# Patient Record
Sex: Male | Born: 1990 | Race: White | Hispanic: No | Marital: Married | Smoking: Never smoker
Health system: Southern US, Community
[De-identification: ages and names within clinical notes are randomized; demographics above are authoritative.]

## PROBLEM LIST (undated history)

## (undated) DIAGNOSIS — F419 Anxiety disorder, unspecified: Secondary | ICD-10-CM

## (undated) DIAGNOSIS — K219 Gastro-esophageal reflux disease without esophagitis: Secondary | ICD-10-CM

## (undated) DIAGNOSIS — T7840XA Allergy, unspecified, initial encounter: Secondary | ICD-10-CM

## (undated) HISTORY — DX: Gastro-esophageal reflux disease without esophagitis: K21.9

## (undated) HISTORY — DX: Allergy, unspecified, initial encounter: T78.40XA

---

## 2019-08-21 ENCOUNTER — Emergency Department (HOSPITAL_COMMUNITY)
Admission: EM | Admit: 2019-08-21 | Discharge: 2019-08-21 | Disposition: A | Discharge status: Home / Self Care | Payer: BC Managed Care – PPO | Attending: Emergency Medicine | Admitting: Emergency Medicine

## 2019-08-21 ENCOUNTER — Other Ambulatory Visit: Payer: Self-pay

## 2019-08-21 ENCOUNTER — Emergency Department (HOSPITAL_COMMUNITY): Payer: BC Managed Care – PPO

## 2019-08-21 ENCOUNTER — Encounter (HOSPITAL_COMMUNITY): Payer: Self-pay

## 2019-08-21 DIAGNOSIS — R000 Tachycardia, unspecified: Secondary | ICD-10-CM | POA: Diagnosis not present

## 2019-08-21 DIAGNOSIS — R079 Chest pain, unspecified: Secondary | ICD-10-CM

## 2019-08-21 DIAGNOSIS — R0789 Other chest pain: Secondary | ICD-10-CM | POA: Insufficient documentation

## 2019-08-21 LAB — BASIC METABOLIC PANEL
Anion gap: 14 (ref 5–15)
BUN: 11 mg/dL (ref 6–20)
CO2: 24 mmol/L (ref 22–32)
Calcium: 9.2 mg/dL (ref 8.9–10.3)
Chloride: 101 mmol/L (ref 98–111)
Creatinine, Ser: 1.16 mg/dL (ref 0.61–1.24)
GFR calc Af Amer: >60 mL/min (ref >60)
GFR calc non Af Amer: >60 mL/min (ref >60)
Glucose, Bld: 134 mg/dL — ABNORMAL HIGH (ref 70–99)
Potassium: 3.1 mmol/L — ABNORMAL LOW (ref 3.5–5.1)
Sodium: 139 mmol/L (ref 135–145)

## 2019-08-21 LAB — CBC
HCT: 44.7 % (ref 39.0–52.0)
Hemoglobin: 14.3 g/dL (ref 13.0–17.0)
MCH: 27.1 pg (ref 26.0–34.0)
MCHC: 32 g/dL (ref 30.0–36.0)
MCV: 84.8 fL (ref 80.0–100.0)
Platelets: 202 10*3/uL (ref 150–400)
RBC: 5.27 MIL/uL (ref 4.22–5.81)
RDW: 14.4 % (ref 11.5–15.5)
WBC: 17.7 10*3/uL — ABNORMAL HIGH (ref 4.0–10.5)
nRBC: 0 % (ref 0.0–0.2)

## 2019-08-21 LAB — TROPONIN I (HIGH SENSITIVITY): Troponin I (High Sensitivity): <2 ng/L (ref <18)

## 2019-08-21 MED ORDER — SODIUM CHLORIDE 0.9% FLUSH: 3.0000 mL | Freq: Once | INTRAVENOUS | Status: DC

## 2019-08-21 NOTE — ED Triage Notes (Signed)
Pt reports L chest pain and epigastric pain with radiation down to his abdomen. Reports that it started around 10p. Endorses alcohol and a weed brownie before symptoms started. A&Ox4. No SOB or cough.

## 2019-08-21 NOTE — ED Notes (Signed)
Patient transported to X-ray 

## 2019-08-21 NOTE — ED Provider Notes (Signed)
[facility]-EMERGENCY DEPT Provider Note   CSN: [id number] Arrival date & time: 08/21/19  0329     History Chief Complaint  Patient presents with  . Chest Pain    Ranger Petrich is a 29 y.o. male.  The history is provided by the patient and medical records.  Chest Pain   29 year old male with no significant past medical history presenting to the ED with chest pain.  Patient reports he was drinking tonight with friends and ate an edible brownie with large quantity of marijuana in it.  States he has never had marijuana before.  States now his chest and his arms feel very "heavy".  He does feel like his heart is racing a bit.  He denies any shortness of breath.  No cough, fever, or other upper respiratory symptoms.  He denies any known cardiac history.  He is not a smoker.  History reviewed. No pertinent past medical history.  There are no problems to display for this patient.      History reviewed. No pertinent family history.  Social History   Tobacco Use  . Smoking status: Not on file  Substance Use Topics  . Alcohol use: Not on file  . Drug use: Not on file    Home Medications Prior to Admission medications   Not on File    Allergies    Patient has no known allergies.  Review of Systems   Review of Systems  Cardiovascular: Positive for chest pain.  All other systems reviewed and are negative.   Physical Exam Updated Vital Signs BP (!) 165/65 (BP Location: Left Arm)   Pulse (!) 113   Temp 98.5 F (36.9 C) (Oral)   Resp (!) 21   Ht 6\' 4"  (1.93 m)   Wt (!) 145.2 kg   SpO2 100%   BMI 38.95 kg/m   Physical Exam Vitals and nursing note reviewed.  Constitutional:      Appearance: He is well-developed.     Comments: Obese, NAD  HENT:     Head: Normocephalic and atraumatic.  Eyes:     Conjunctiva/sclera: Conjunctivae normal.     Pupils: Pupils are equal, round, and reactive to light.  Cardiovascular:     Rate and Rhythm: Regular  rhythm. Tachycardia present.     Heart sounds: Normal heart sounds.     Comments: Tachy around 105-110 during exam Pulmonary:     Effort: Pulmonary effort is normal.     Breath sounds: Normal breath sounds. No decreased breath sounds or wheezing.  Abdominal:     General: Bowel sounds are normal.     Palpations: Abdomen is soft.  Musculoskeletal:        General: Normal range of motion.     Cervical back: Normal range of motion.  Skin:    General: Skin is warm and dry.  Neurological:     Mental Status: He is alert and oriented to person, place, and time.     ED Results / Procedures / Treatments   Labs (all labs ordered are listed, but only abnormal results are displayed) Labs Reviewed  CBC - Abnormal; Notable for the following components:      Result Value   WBC 17.7 (*)    All other components within normal limits  BASIC METABOLIC PANEL  TROPONIN I (HIGH SENSITIVITY)  TROPONIN I (HIGH SENSITIVITY)    EKG None  Radiology DG Chest 2 View  Result Date: 08/21/2019 CLINICAL DATA:  Chest pain. EXAM: CHEST -  2 VIEW COMPARISON:  None. FINDINGS: The cardiomediastinal contours are normal. The lungs are clear. Pulmonary vasculature is normal. No consolidation, pleural effusion, or pneumothorax. No acute osseous abnormalities are seen. IMPRESSION: Unremarkable radiographs of the chest. Electronically Signed   By: Keith Rake M.D.   On: 08/21/2019 04:20    Procedures Procedures (including critical care time)  Medications Ordered in ED Medications  sodium chloride flush (NS) 0.9 % injection 3 mL (0 mLs Intravenous Hold 08/21/19 0404)    ED Course  I have reviewed the triage vital signs and the nursing notes.  Pertinent labs & imaging results that were available during my care of the patient were reviewed by me and considered in my medical decision making (see chart for details).    MDM Rules/Calculators/A&P  29 year old male here with chest pain after drinking alcohol and  eating an edible brownie with large amount of marijuana.  States his chest and arms feel "heavy".  He is tachycardic on arrival around 125, this improved to around 105 during my exam.  He does feel his heart racing a bit.  EKG with sinus tachycardia but no acute ischemic changes.  CXR performed from triage and is clear.  Labs are pending, suspect this is due to substance abuse.  Labs overall reassuring.  Troponin negative.  Suspect symptoms due to his alcohol and marijuana use tonight.  Symptoms are atypical for ACS, PE, dissection, acute cardiac event.  Tachycardia continues improving.  Feel he is stable for discharge.  Recommend to avoid illicit substances, especially when combined with alcohol.  Can follow-up with PCP.  Return here for any new/acute changes.  Final Clinical Impression(s) / ED Diagnoses Final diagnoses:  Chest pain in adult    Rx / DC Orders ED Discharge Orders    None       Larene Pickett, PA-C 08/21/19 0559    Fatima Blank, MD 08/21/19 [phone]

## 2019-08-21 NOTE — Discharge Instructions (Signed)
All cardiac tests today looked great. Try to avoid marijuana and other illicit substances, especially when mixed with alcohol. Follow-up with your primary care doctor as needed. Return here for any new/acute changes.

## 2020-05-05 IMAGING — CR DG CHEST 2V
2 series · 2 of 2 positions shown · non-contrast
Comparison: None.

CLINICAL DATA: Chest pain.

EXAM:
CHEST - 2 VIEW

[w chest lat]
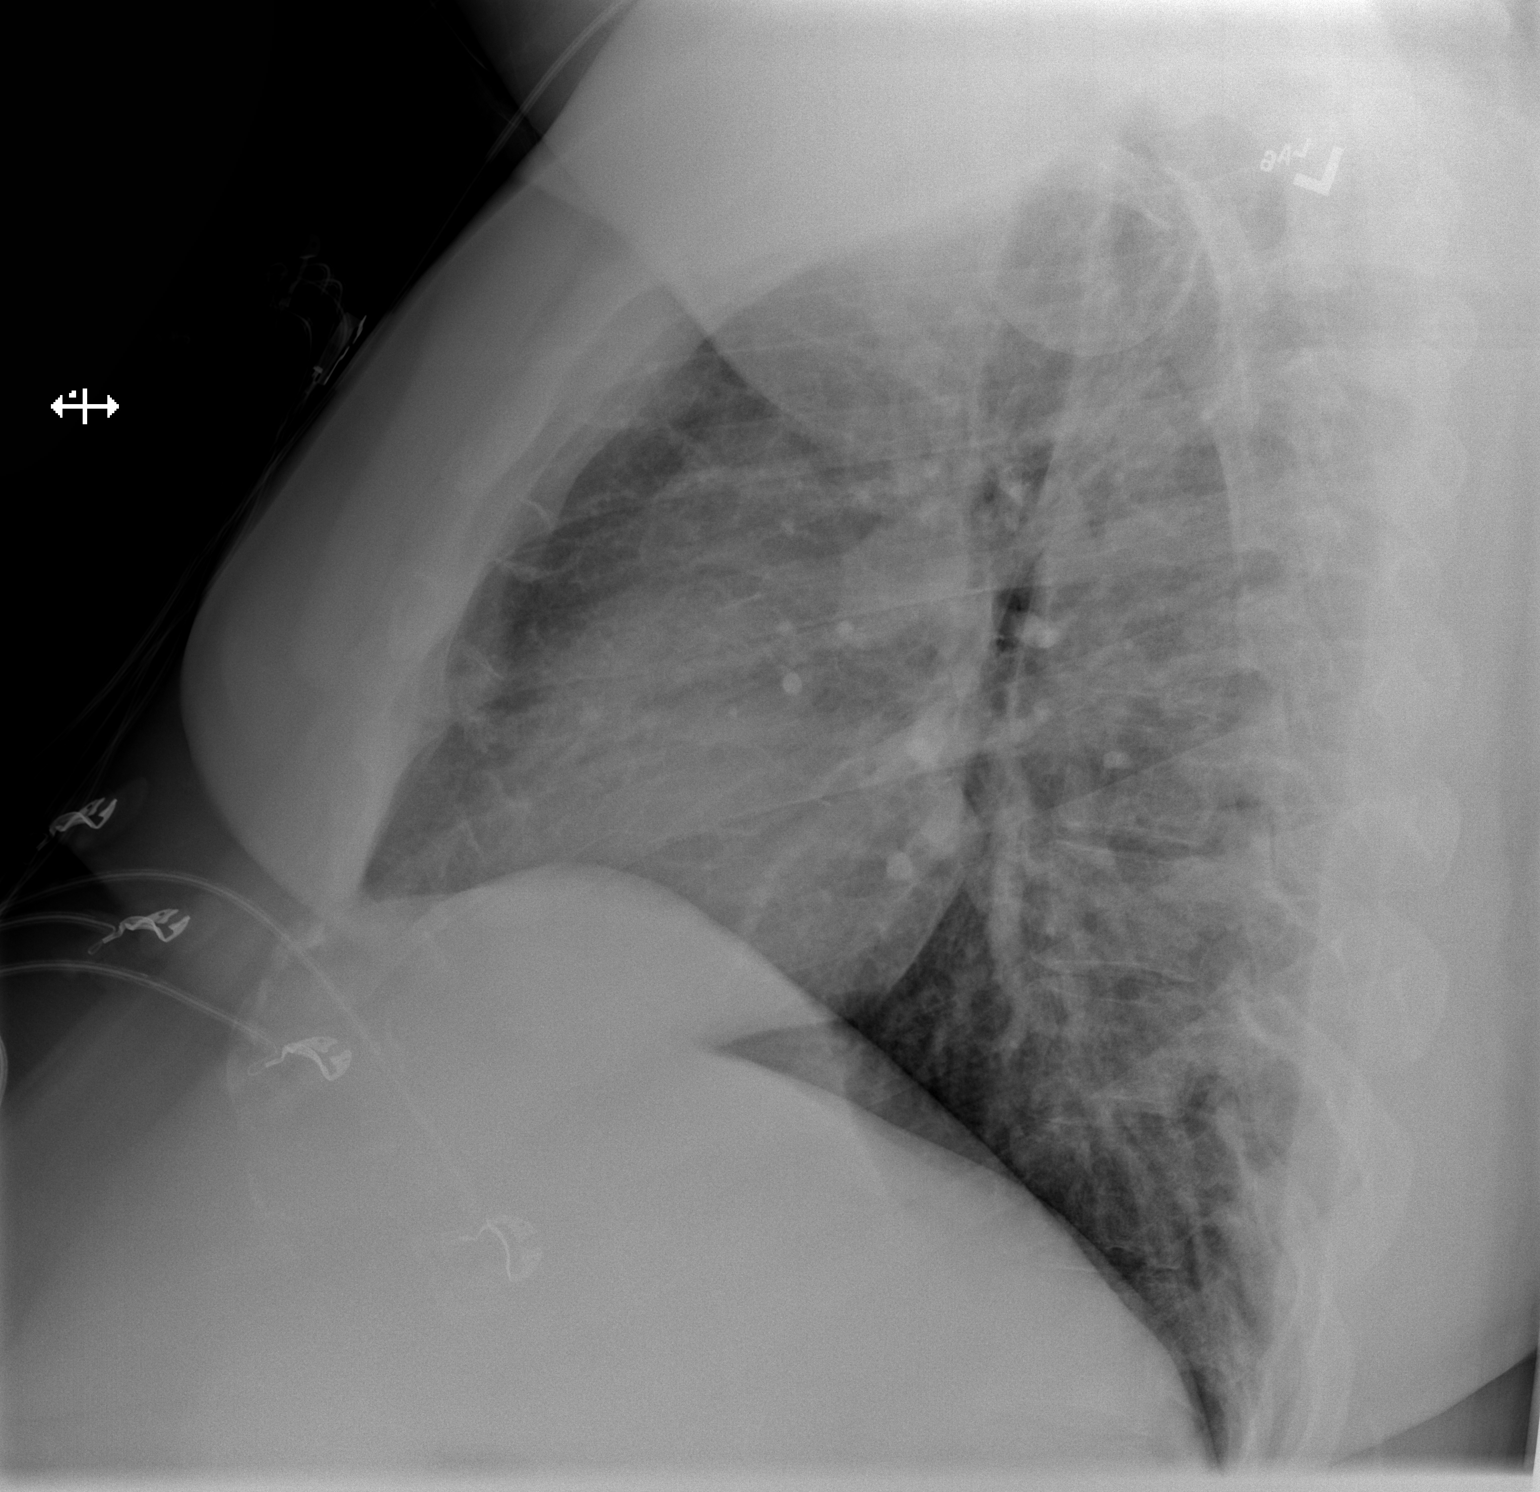

[x chest ap]
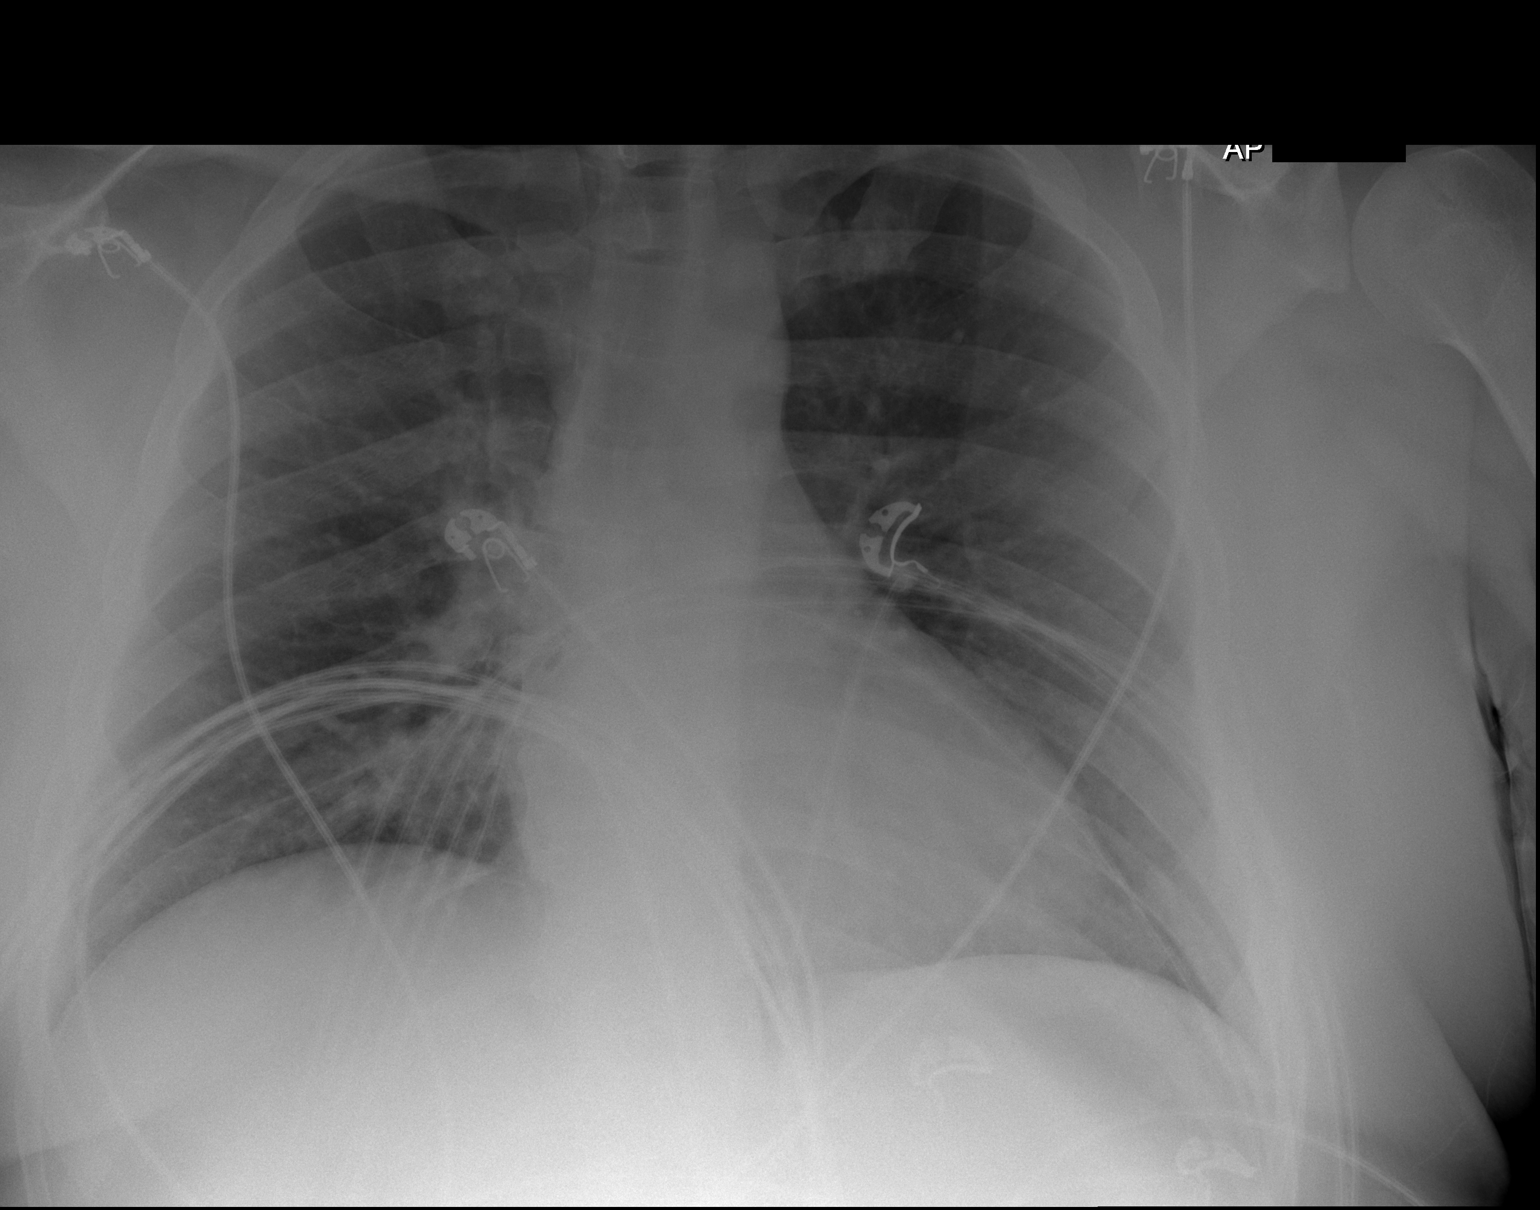

[2 of 2 positions shown; findings below may reference images not displayed]

FINDINGS: The cardiomediastinal contours are normal. The lungs are clear.
Pulmonary vasculature is normal. No consolidation, pleural effusion,
or pneumothorax. No acute osseous abnormalities are seen.
IMPRESSION: Unremarkable radiographs of the chest.

## 2020-09-09 ENCOUNTER — Ambulatory Visit: Payer: Self-pay | Admitting: Nurse Practitioner

## 2020-09-15 ENCOUNTER — Ambulatory Visit: Payer: Self-pay | Admitting: Nurse Practitioner

## 2021-08-15 ENCOUNTER — Ambulatory Visit: Payer: Self-pay

## 2022-06-08 ENCOUNTER — Emergency Department (HOSPITAL_COMMUNITY)
Admission: EM | Admit: 2022-06-08 | Discharge: 2022-06-09 | Disposition: A | Discharge status: Home / Self Care | Payer: BC Managed Care – PPO | Attending: Emergency Medicine | Admitting: Emergency Medicine

## 2022-06-08 ENCOUNTER — Emergency Department (HOSPITAL_COMMUNITY): Payer: BC Managed Care – PPO

## 2022-06-08 ENCOUNTER — Encounter (HOSPITAL_COMMUNITY): Payer: Self-pay | Admitting: Emergency Medicine

## 2022-06-08 ENCOUNTER — Other Ambulatory Visit: Payer: Self-pay

## 2022-06-08 DIAGNOSIS — M25512 Pain in left shoulder: Secondary | ICD-10-CM | POA: Insufficient documentation

## 2022-06-08 DIAGNOSIS — R110 Nausea: Secondary | ICD-10-CM | POA: Diagnosis not present

## 2022-06-08 DIAGNOSIS — R1013 Epigastric pain: Secondary | ICD-10-CM | POA: Insufficient documentation

## 2022-06-08 DIAGNOSIS — R42 Dizziness and giddiness: Secondary | ICD-10-CM | POA: Diagnosis present

## 2022-06-08 HISTORY — DX: Anxiety disorder, unspecified: F41.9

## 2022-06-08 LAB — COMPREHENSIVE METABOLIC PANEL
ALT: 30 U/L (ref 0–44)
AST: 24 U/L (ref 15–41)
Albumin: 4.8 g/dL (ref 3.5–5.0)
Alkaline Phosphatase: 57 U/L (ref 38–126)
Anion gap: 18 — ABNORMAL HIGH (ref 5–15)
BUN: 10 mg/dL (ref 6–20)
CO2: 23 mmol/L (ref 22–32)
Calcium: 10.5 mg/dL — ABNORMAL HIGH (ref 8.9–10.3)
Chloride: 101 mmol/L (ref 98–111)
Creatinine, Ser: 1.09 mg/dL (ref 0.61–1.24)
GFR, Estimated: >60 mL/min (ref >60)
Glucose, Bld: 96 mg/dL (ref 70–99)
Potassium: 4.4 mmol/L (ref 3.5–5.1)
Sodium: 142 mmol/L (ref 135–145)
Total Bilirubin: 1.2 mg/dL (ref 0.3–1.2)
Total Protein: 8.6 g/dL — ABNORMAL HIGH (ref 6.5–8.1)

## 2022-06-08 LAB — CBC WITH DIFFERENTIAL/PLATELET
Abs Immature Granulocytes: 0.02 10*3/uL (ref 0.00–0.07)
Basophils Absolute: 0.1 10*3/uL (ref 0.0–0.1)
Basophils Relative: 1 %
Eosinophils Absolute: 0.1 10*3/uL (ref 0.0–0.5)
Eosinophils Relative: 1 %
HCT: 46.7 % (ref 39.0–52.0)
Hemoglobin: 15.3 g/dL (ref 13.0–17.0)
Immature Granulocytes: 0 %
Lymphocytes Relative: 30 %
Lymphs Abs: 2.6 10*3/uL (ref 0.7–4.0)
MCH: 27.5 pg (ref 26.0–34.0)
MCHC: 32.8 g/dL (ref 30.0–36.0)
MCV: 83.8 fL (ref 80.0–100.0)
Monocytes Absolute: 0.6 10*3/uL (ref 0.1–1.0)
Monocytes Relative: 7 %
Neutro Abs: 5.5 10*3/uL (ref 1.7–7.7)
Neutrophils Relative %: 61 %
Platelets: 202 10*3/uL (ref 150–400)
RBC: 5.57 MIL/uL (ref 4.22–5.81)
RDW: 13.7 % (ref 11.5–15.5)
WBC: 8.9 10*3/uL (ref 4.0–10.5)
nRBC: 0 % (ref 0.0–0.2)

## 2022-06-08 LAB — TROPONIN I (HIGH SENSITIVITY)
Troponin I (High Sensitivity): <2 ng/L (ref <18)
Troponin I (High Sensitivity): 4 ng/L (ref <18)

## 2022-06-08 MED ORDER — FAMOTIDINE 20 MG PO TABS
20.0000 mg | ORAL_TABLET | Freq: Once | ORAL | Status: AC
  Administered 2022-06-08: 20 mg via ORAL
  Filled 2022-06-08: qty 1

## 2022-06-08 MED ORDER — LIDOCAINE VISCOUS HCL 2 % MT SOLN
15.0000 mL | Freq: Once | OROMUCOSAL | Status: AC
  Administered 2022-06-08: 15 mL via ORAL
  Filled 2022-06-08: qty 15

## 2022-06-08 MED ORDER — HYDROXYZINE HCL 25 MG PO TABS
25.0000 mg | ORAL_TABLET | Freq: Once | ORAL | Status: AC
  Administered 2022-06-08: 25 mg via ORAL
  Filled 2022-06-08: qty 1

## 2022-06-08 MED ORDER — ALUM & MAG HYDROXIDE-SIMETH 200-200-20 MG/5ML PO SUSP
30.0000 mL | Freq: Once | ORAL | Status: AC
  Administered 2022-06-08: 30 mL via ORAL
  Filled 2022-06-08: qty 30

## 2022-06-08 NOTE — ED Triage Notes (Signed)
Patient arrives ambulatory by POV c/o nausea, light headed feeling and left shoulder pain. Patient was seen at [facility] last week started on Protonix and carafate. States he noticed his symptoms improved depending on what he ate. Denies any pain at this time and states he is trying to keep himself calm.

## 2022-06-08 NOTE — ED Provider Triage Note (Signed)
Emergency Medicine Provider Triage Evaluation Note  Oscar Powers , a 31 y.o. male  was evaluated in triage.  Pt complains of nausea, left shoulder pain, lightheadedness.  Patient states he has a history of similar events in the past.  He reports he went to an urgent care 1 week ago and was given Carafate as well as omeprazole which helped his symptoms up until yesterday.  He reports being out of Carafate today.  Symptoms began yesterday when he was eating.  He reports persistence of symptoms since onset.  Patient reports feeling anxious once noticing symptoms which "makes my symptoms worse."  Denies fever, chills, night sweats, cough, congestion, shortness of breath, chest pain, vomiting, abdominal pain..  Review of Systems  Positive: See above Negative:   Physical Exam  BP (!) 152/93 (BP Location: Right Arm)   Pulse 62   Temp 98.4 F (36.9 C) (Oral)   Resp 18   Ht 6\' 4"  (1.93 m)   Wt (!) 145.2 kg   SpO2 100%   BMI 38.95 kg/m  Gen:   Awake, no distress   Resp:  Normal effort  MSK:   Moves extremities without difficulty  Other:  No obvious murmurs gallops or rubs.  Lungs clear to auscultation bilaterally.  No abdominal tenderness to exam  Medical Decision Making  Medically screening exam initiated at 11:03 AM.  Appropriate orders placed.  Oscar Powers was informed that the remainder of the evaluation will be completed by another provider, this initial triage assessment does not replace that evaluation, and the importance of remaining in the ED until their evaluation is complete.     Wilnette Kales, [place] 06/08/22 1105

## 2022-06-09 ENCOUNTER — Telehealth: Payer: Self-pay

## 2022-06-09 LAB — LIPASE, BLOOD: Lipase: 31 U/L (ref 11–51)

## 2022-06-09 MED ORDER — FAMOTIDINE 20 MG PO TABS: 20.0000 mg | ORAL_TABLET | Freq: Two times a day (BID) | ORAL | 0 refills | Status: DC

## 2022-06-09 NOTE — ED Provider Notes (Signed)
[facility] EMERGENCY DEPARTMENT Provider Note   CSN: [id number] Arrival date & time: 06/08/22  0915     History  Chief Complaint  Patient presents with   Nausea   Dizziness    Oscar Powers is a 31 y.o. male who presents to the ER with concerns of nausea, lightheadedness, and left shoulder pain.  Patient was seen in urgent care last week and started on Protonix and sucralfate.  He states he notices his symptoms improved depending on what he ate and felt like the sucralfate helped improve his symptoms as well.  He is currently denying any pain, states when he did have pain it was to his left shoulder and under his left rib cage.  This painful feeling caused him to feel very anxious and he felt like he may have a panic attack.  Patient has been following a bland diet, however, he reports still consuming some spicy foods and orange juice.  He also reports symptoms of gagging, typically worse in the morning, with some regurgitation but not vomiting.  Deni chest pain, shortness of breath, syncope, vomiting, diarrhea, constipation, fever, chills, urinary changes.      Home Medications Prior to Admission medications   Medication Sig Start Date End Date Taking? Authorizing Provider  famotidine (PEPCID) 20 MG tablet Take 1 tablet (20 mg total) by mouth 2 (two) times daily. 06/09/22 07/09/22 Yes Tamiya Colello R, PA      Allergies    Patient has no known allergies.    Review of Systems   Review of Systems  Constitutional:  Negative for appetite change, chills and fever.  Cardiovascular:  Negative for chest pain, palpitations and leg swelling.  Gastrointestinal:  Positive for nausea. Negative for abdominal pain, constipation, diarrhea and vomiting.  Musculoskeletal:        Shoulder pain and rib cage pain  Neurological:  Positive for light-headedness. Negative for dizziness, syncope and weakness.    Physical Exam Updated Vital Signs BP (!) 124/58   Pulse 62   Temp  98.1 F (36.7 C)   Resp 16   Ht 6\' 4"  (1.93 m)   Wt (!) 145.2 kg   SpO2 99%   BMI 38.95 kg/m  Physical Exam  ED Results / Procedures / Treatments   Labs (all labs ordered are listed, but only abnormal results are displayed) Labs Reviewed  COMPREHENSIVE METABOLIC PANEL - Abnormal; Notable for the following components:      Result Value   Calcium 10.5 (*)    Total Protein 8.6 (*)    Anion gap 18 (*)    All other components within normal limits  CBC WITH DIFFERENTIAL/PLATELET  LIPASE, BLOOD  LIPASE, BLOOD  TROPONIN I (HIGH SENSITIVITY)  TROPONIN I (HIGH SENSITIVITY)    EKG EKG Interpretation  Date/Time:  Friday June 08 2022 10:46:14 EDT Ventricular Rate:  72 PR Interval:  152 QRS Duration: 104 QT Interval:  386 QTC Calculation: 422 R Axis:   134 Text Interpretation: Normal sinus rhythm with sinus arrhythmia Right axis deviation Abnormal ECG Since last tracing rate slower Otherwise no significant change Confirmed by [id number] [id number]) on 06/09/2022 1:08:07 AM  Radiology DG Chest 1 View  Result Date: 06/08/2022 CLINICAL DATA:  Chest pain EXAM: CHEST  1 VIEW COMPARISON:  08/21/2019 FINDINGS: The heart size and mediastinal contours are within normal limits. Both lungs are clear. The visualized skeletal structures are unremarkable. IMPRESSION: No active disease. Electronically Signed   By: 10/19/2019.  Shick M.D.  On: 06/08/2022 11:37    Procedures Procedures    Medications Ordered in ED Medications  famotidine (PEPCID) tablet 20 mg (20 mg Oral Given 06/08/22 1114)  alum & mag hydroxide-simeth (MAALOX/MYLANTA) 200-200-20 MG/5ML suspension 30 mL (30 mLs Oral Given 06/08/22 1116)    And  lidocaine (XYLOCAINE) 2 % viscous mouth solution 15 mL (15 mLs Oral Given 06/08/22 1116)  hydrOXYzine (ATARAX) tablet 25 mg (25 mg Oral Given 06/08/22 1114)    ED Course/ Medical Decision Making/ A&P Clinical Course as of 06/09/22 0248  Sat Jun 09, 2022  0203 Lipase, blood [MC]     Clinical Course User Index [MC] Pat Kocher, PA                           Medical Decision Making  Patient presents to the ER with concerns of lightheadedness, shoulder pain, nausea that made him feel anxious.  He also reports symptoms of reflux, is currently undergoing PPI trial.  Finished taking sucralfate.  He has been on PPI since last Wednesday.  He denies any nausea or abdominal pain at this time.  Physical exam findings are reassuring, he has no abdominal pain on palpation, bowel sounds are normal, not currently experiencing rib or shoulder pain.  He states treatment with famotidine and GI cocktail improved his symptoms significantly.  ECG demonstrates sinus rhythm with sinus arrhythmia and right axis deviation.  Chest x-ray reveals no active disease.  Laboratory work-up reveals mildly elevated calcium at 10.5, no leukocytosis, no anemia, troponin negative.  Lipase normal, no evidence of pancreatitis.   Not suspicious for cardiac cause of rib cage and shoulder pain.  Due to timing of patient's symptoms associated with eating reflux triggering foods, likely related to gastroesophageal reflux disease.  He reports that his symptoms were improving on PPI and sucralfate.  Educated patient on managing reflux symptoms at home, following bland diet, and avoiding trigger foods.  Plan to discharge patient home with recommendation of close follow-up with PCP and referral for GI.  I do not feel that any additional work-up or imaging is required at this time.  Plan to discharge patient and change medication to famotidine twice daily, discontinue PPI.  Patient agrees with plan for discharge.  Return precautions given.  Discussed HPI, work-up, assessment and plan with attending Deno Etienne who agrees with plan.         Final Clinical Impression(s) / ED Diagnoses Final diagnoses:  Dyspepsia    Rx / DC Orders ED Discharge Orders          Ordered    famotidine (PEPCID) 20 MG tablet  2 times  daily        06/09/22 0244              Theressa Stamps R, PA 06/09/22 [place], [place], DO 06/09/22 [phone]

## 2022-06-09 NOTE — Telephone Encounter (Signed)
Wife called in for patient needs medication ordered switched to [facility] on [address].. Prescription called in to [facility] on [address]

## 2022-06-09 NOTE — Discharge Instructions (Addendum)
You were seen in the ER for symptoms likely related to reflux.  Your laboratory work-up and ECG were reassuring that cause of your pain is likely not heart related.  I have attached information about indigestion and abdominal pain.  I am also sending you home on a new prescription of famotidine.  You will take this medication twice a day.  Stop taking the PPI.   I highly recommend you set up an appointment with a PCP for continued follow-up for your symptoms.  I am also referring you to a GI doctor.  Return to the ER if you develop new or worsening symptoms.

## 2022-06-11 ENCOUNTER — Other Ambulatory Visit: Payer: Self-pay

## 2022-06-11 ENCOUNTER — Telehealth: Payer: Self-pay

## 2022-06-11 DIAGNOSIS — R42 Dizziness and giddiness: Secondary | ICD-10-CM

## 2022-06-11 NOTE — Addendum Note (Signed)
Addended by: Berniece Salines A on: 06/11/2022 12:03 PM   Modules accepted: Orders

## 2022-06-11 NOTE — Telephone Encounter (Signed)
Cirigliano, Oscar Pea, Oscar Powers  Marice Potter, RN Patient was seen in the ER over the weekend.  No previous evaluations in our clinic.  Can you please call to check in on him early next week.  If ongoing symptoms, can schedule follow-up in the GI clinic.  If symptoms have resolved, can call the office  prn.  Recommend establishing with PCM as well, regardless if symptoms ongoing or resolved to establish care.

## 2022-06-11 NOTE — Telephone Encounter (Signed)
Called and spoke with pt. Pt reports that nausea has completely resolved and the only symptom he has is lightheadedness. Let pt know he can establish care with PCP for lightheadedness.

## 2022-08-21 ENCOUNTER — Ambulatory Visit: Payer: 59 | Admitting: Family Medicine

## 2022-08-21 ENCOUNTER — Encounter: Payer: Self-pay | Admitting: Family Medicine

## 2022-08-21 VITALS — BP 130/78 | HR 61 | Temp 97.5°F | Ht 76.0 in | Wt 318.2 lb

## 2022-08-21 DIAGNOSIS — R1012 Left upper quadrant pain: Secondary | ICD-10-CM | POA: Diagnosis not present

## 2022-08-21 DIAGNOSIS — K219 Gastro-esophageal reflux disease without esophagitis: Secondary | ICD-10-CM | POA: Diagnosis not present

## 2022-08-21 DIAGNOSIS — J0101 Acute recurrent maxillary sinusitis: Secondary | ICD-10-CM | POA: Diagnosis not present

## 2022-08-21 LAB — CBC WITH DIFFERENTIAL/PLATELET
Basophils Absolute: 0 10*3/uL (ref 0.0–0.1)
Basophils Relative: 0.5 % (ref 0.0–3.0)
Eosinophils Absolute: 0.2 10*3/uL (ref 0.0–0.7)
Eosinophils Relative: 2.4 % (ref 0.0–5.0)
HCT: 45.1 % (ref 39.0–52.0)
Hemoglobin: 15.1 g/dL (ref 13.0–17.0)
Lymphocytes Relative: 46 % (ref 12.0–46.0)
Lymphs Abs: 3.5 10*3/uL (ref 0.7–4.0)
MCHC: 33.5 g/dL (ref 30.0–36.0)
MCV: 80.6 fl (ref 78.0–100.0)
Monocytes Absolute: 0.6 10*3/uL (ref 0.1–1.0)
Monocytes Relative: 8.2 % (ref 3.0–12.0)
Neutro Abs: 3.2 10*3/uL (ref 1.4–7.7)
Neutrophils Relative %: 42.9 % — ABNORMAL LOW (ref 43.0–77.0)
Platelets: 196 10*3/uL (ref 150.0–400.0)
RBC: 5.6 Mil/uL (ref 4.22–5.81)
RDW: 14.3 % (ref 11.5–15.5)
WBC: 7.6 10*3/uL (ref 4.0–10.5)

## 2022-08-21 LAB — H. PYLORI ANTIBODY, IGG: H Pylori IgG: NEGATIVE

## 2022-08-21 LAB — COMPREHENSIVE METABOLIC PANEL
ALT: 40 U/L (ref 0–53)
AST: 23 U/L (ref 0–37)
Albumin: 4.8 g/dL (ref 3.5–5.2)
Alkaline Phosphatase: 70 U/L (ref 39–117)
BUN: 11 mg/dL (ref 6–23)
CO2: 27 mEq/L (ref 19–32)
Calcium: 9.8 mg/dL (ref 8.4–10.5)
Chloride: 104 mEq/L (ref 96–112)
Creatinine, Ser: 1.1 mg/dL (ref 0.40–1.50)
GFR: 90.74 mL/min (ref >60.00)
Glucose, Bld: 100 mg/dL — ABNORMAL HIGH (ref 70–99)
Potassium: 4.5 mEq/L (ref 3.5–5.1)
Sodium: 140 mEq/L (ref 135–145)
Total Bilirubin: 0.9 mg/dL (ref 0.2–1.2)
Total Protein: 8 g/dL (ref 6.0–8.3)

## 2022-08-21 LAB — TSH: TSH: 1.74 u[IU]/mL (ref 0.35–5.50)

## 2022-08-21 MED ORDER — AMOXICILLIN-POT CLAVULANATE 875-125 MG PO TABS: 1.0000 | ORAL_TABLET | Freq: Two times a day (BID) | ORAL | 0 refills | Status: DC

## 2022-08-21 NOTE — Progress Notes (Signed)
Labs ok.  Continue w/plan as discussed

## 2022-08-21 NOTE — Progress Notes (Signed)
New Patient Office Visit  Subjective:  Patient ID: Oscar Powers, male    DOB: [birth year]  Age: 32 y.o. MRN: [id number]  CC:  Chief Complaint  Patient presents with   Gastroesophageal Reflux    Pt states 3x years ago on new years, celebrated a little too hard and ended up in ER, pt states had panic attack, and felt pain in left shoulder, thought was having heart attack. Pt states he has panic attack here and there and pain is still present. Pt states went to ER in oct for the same thing, and MD gave lidocaine and Pepcid, pt states it helped tremendously, MD said to take Pepcid twice daily. Pt wants to know what's causing pain in left shoulder, and also has sinus infection   New Patient (Initial Visit)    HPI Oscar Powers presents for new pt.  Pain L shoulder.   Pain L shoulder and uner rib cage.  Intermitt-has been to ER 3 yrs ago-told panic attack. In ER in Oct.  Told GERD-meds helped. Certain foods will flare it-red meats, spicey. Was getting worse and pain worse.  UC-protonix and sulcrafate but got worse so ER in oct-got lidocaine and pepcid and felt better.  Taking pepcid bid since.  Helps a lot but occ getting pain if "eating bad".  Has lost 20#-working on bland diet.  Some gagging but not vomit. No chronic diarrhea.  No dysphagia. Occ dark stools.  2 mo ago, some brbpr(thinks constipated) Sinuses occ-congestion, pressure for 1 wk.  No f/c.  Neg covid. Minor cough.   Panic/anxiety-long time.  Flares when pain L shoulder.  A lot of stress at work and step- Dad has prostate Ca  Past Medical History:  Diagnosis Date   Anxiety     History reviewed. No pertinent surgical history.  Family History  Problem Relation Age of Onset   Hypertension Mother     Social History   Socioeconomic History   Marital status: Married    Spouse name: Not on file   Number of children: 1   Years of education: Not on file   Highest education level: Master's degree (e.g., MA, MS, MEng, MEd, MSW, MBA)   Occupational History   Not on file  Tobacco Use   Smoking status: Never   Smokeless tobacco: Never  Vaping Use   Vaping Use: Never used  Substance and Sexual Activity   Alcohol use: Not Currently   Drug use: Not Currently   Sexual activity: Not on file  Other Topics Concern   Not on file  Social History Narrative   Psychologist, forensic [employer]   Social Determinants of Health   Financial Resource Strain: Not on file  Food Insecurity: Not on file  Transportation Needs: Not on file  Physical Activity: Not on file  Stress: Not on file  Social Connections: Not on file  Intimate Partner Violence: Not on file    ROS  ROS: Gen: no fever, chills  Skin: no rash, itching ENT: no ear pain, ear drainage, nasal congestion, rhinorrhea, sinus pressure, sore throat Eyes: no blurry vision, double vision Resp: no cough, wheeze,SOB CV: no CP, palpitations, LE edema,  GI: no heartburn, n/v/d/c, abd pain GU: no dysuria, urgency, frequency, hematuria MSK: no joint pain, myalgias, back pain Neuro: no dizziness, headache, weakness, vertigo Psych: no depression, anxiety, insomnia, SI   Objective:   Today's Vitals: BP 130/78 (BP Location: Right Arm, Patient Position: Sitting)   Pulse 61   Temp (!) 97.5 F (  36.4 C) (Temporal)   Ht 6\' 4"  (1.93 m)   Wt (!) 318 lb 3.2 oz (144.3 kg)   SpO2 98%   BMI 38.73 kg/m   Physical Exam  Gen: WDWN NAD HEENT: NCAT, conjunctiva not injected, sclera nonicteric TM WNL B, OP moist, no exudates  NECK:  supple, no thyromegaly, no nodes, no carotid bruits CARDIAC: RRR, S1S2+, no murmur.  LUNGS: CTAB. No wheezes ABDOMEN:  BS+, soft, sl tender R of mid abd, No HSM, no masses EXT:  no edema MSK: no gross abnormalities.  NEURO: A&O x3.  CN II-XII intact.  PSYCH: normal mood. Good eye contact   Assessment & Plan:   Problem List Items Addressed This Visit   None Visit Diagnoses     Gastroesophageal reflux disease without esophagitis    -   Primary   Relevant Orders   Comprehensive metabolic panel   CBC with Differential/Platelet   TSH   H. pylori antibody, IgG   US Abdomen Complete   LUQ pain       Relevant Orders   Comprehensive metabolic panel   CBC with Differential/Platelet   TSH   H. pylori antibody, IgG   US Abdomen Complete   Acute recurrent maxillary sinusitis       Relevant Medications   amoxicillin-clavulanate (AUGMENTIN) 875-125 MG tablet      LUQ pain-?PUD,?gallbladder(even though no RUQ pain), other.  Check cbc,cmp,tsh,h pylori, U/S abd.   GERD-chronic.  Some better w/pepcid bid-cont.  Check above labs.  Avoid food triggers.  F/u 1 mo Recurrent Max sinusisit-augmentin 875 bid.  Outpatient Encounter Medications as of 08/21/2022  Medication Sig   amoxicillin-clavulanate (AUGMENTIN) 875-125 MG tablet Take 1 tablet by mouth 2 (two) times daily.   [DISCONTINUED] famotidine (PEPCID) 20 MG tablet Take 1 tablet (20 mg total) by mouth 2 (two) times daily.   No facility-administered encounter medications on file as of 08/21/2022.    Follow-up: Return in about 4 weeks (around 09/18/2022) for annual.   Wellington Hampshire, MD

## 2022-08-21 NOTE — Patient Instructions (Signed)
Welcome to [facility] at [facility]! It was a pleasure meeting you today.  As discussed, Please schedule a 1 month follow up visit today.  PLEASE NOTE:  If you had any LAB tests please let us know if you have not heard back within a few days. You may see your results on MyChart before we have a chance to review them but we will give you a call once they are reviewed by us. If we ordered any REFERRALS today, please let us know if you have not heard from their office within the next week.  Let us know through MyChart if you are needing REFILLS, or have your pharmacy send us the request. You can also use MyChart to communicate with me or any office staff.  Please try these tips to maintain a healthy lifestyle:  Eat most of your calories during the day when you are active. Eliminate processed foods including packaged sweets (pies, cakes, cookies), reduce intake of potatoes, white bread, white pasta, and white rice. Look for whole grain options, oat flour or almond flour.  Each meal should contain half fruits/vegetables, one quarter protein, and one quarter carbs (no bigger than a computer mouse).  Cut down on sweet beverages. This includes juice, soda, and sweet tea. Also watch fruit intake, though this is a healthier sweet option, it still contains natural sugar! Limit to 3 servings daily.  Drink at least 1 glass of water with each meal and aim for at least 8 glasses per day  Exercise at least 150 minutes every week.   

## 2022-09-11 ENCOUNTER — Ambulatory Visit
Admission: RE | Admit: 2022-09-11 | Discharge: 2022-09-11 | Disposition: A | Discharge status: Home / Self Care | Payer: 59 | Source: Ambulatory Visit | Attending: Family Medicine | Admitting: Family Medicine

## 2022-09-11 DIAGNOSIS — R1012 Left upper quadrant pain: Secondary | ICD-10-CM | POA: Diagnosis not present

## 2022-09-11 DIAGNOSIS — K219 Gastro-esophageal reflux disease without esophagitis: Secondary | ICD-10-CM

## 2022-09-11 DIAGNOSIS — K760 Fatty (change of) liver, not elsewhere classified: Secondary | ICD-10-CM | POA: Diagnosis not present

## 2022-09-13 ENCOUNTER — Other Ambulatory Visit: Payer: Self-pay | Admitting: *Deleted

## 2022-09-13 ENCOUNTER — Encounter: Payer: Self-pay | Admitting: Family Medicine

## 2022-09-13 MED ORDER — CEFDINIR 300 MG PO CAPS: 300.0000 mg | ORAL_CAPSULE | Freq: Two times a day (BID) | ORAL | 0 refills | Status: AC

## 2022-09-28 ENCOUNTER — Encounter: Payer: Self-pay | Admitting: Family Medicine

## 2022-09-28 ENCOUNTER — Ambulatory Visit (INDEPENDENT_AMBULATORY_CARE_PROVIDER_SITE_OTHER): Payer: 59 | Admitting: Family Medicine

## 2022-09-28 VITALS — BP 114/79 | HR 64 | Temp 98.4°F | Ht 76.0 in | Wt 317.1 lb

## 2022-09-28 DIAGNOSIS — Z0000 Encounter for general adult medical examination without abnormal findings: Secondary | ICD-10-CM | POA: Diagnosis not present

## 2022-09-28 NOTE — Progress Notes (Signed)
Phone: [phone]   Subjective:  Patient 32 y.o. male presenting for annual physical.  Chief Complaint  Patient presents with   Annual Exam    CPE Not fasting    Annual-trying to exercise.  Seeing DDS.  See problem oriented charting- ROS- ROS: Gen: no fever, chills  Skin: no rash, itching ENT: no ear pain, ear drainage, nasal congestion, rhinorrhea, sinus pressure, sore throat Eyes: no blurry vision, double vision Resp: no cough, wheeze,SOB CV: no CP, palpitations, LE edema,  GI: no heartburn, n/v/d/c,.  Still some abd pain-"little bit"   getting better.   GU: no dysuria, urgency, frequency, hematuria. No ED MSK: no joint pain, myalgias, back pain Neuro: no  headache, weakness, vertigo.  Occ light-headed-poss if not eaten in "awhile" or first am. Drinks water. Psych: no depression, anxiety, insomnia, SI .   Anx and stress.  The following were reviewed and entered/updated in epic: Past Medical History:  Diagnosis Date   Anxiety    There are no problems to display for this patient.  History reviewed. No pertinent surgical history.  Family History  Problem Relation Age of Onset   Hypertension Mother     Medications- reviewed and updated No current outpatient medications on file.   No current facility-administered medications for this visit.    Allergies-reviewed and updated No Known Allergies  Social History   Social History Narrative   Psychologist, forensic [employer]   Objective  Objective:  BP 114/79   Pulse 64   Temp 98.4 F (36.9 C) (Temporal)   Ht 6' 4"$  (1.93 m)   Wt (!) 317 lb 2 oz (143.8 kg)   SpO2 95%   BMI 38.60 kg/m  Physical Exam  Gen: WDWN NAD HEENT: NCAT, conjunctiva not injected, sclera nonicteric TM WNL B, OP moist, no exudates  NECK:  supple, no thyromegaly, no nodes, no carotid bruits CARDIAC: RRR, S1S2+, no murmur. DP 2+B LUNGS: CTAB. No wheezes ABDOMEN:  BS+, soft, NTND, No HSM, no masses EXT:  no edema MSK: no gross  abnormalities. MS 5/5 all 4 NEURO: A&O x3.  CN II-XII intact.  PSYCH: normal mood. Good eye contact     Assessment and Plan   Health Maintenance counseling: 1. Anticipatory guidance: Patient counseled regarding regular dental exams q6 months, eye exams yearly, avoiding smoking and second hand smoke, limiting alcohol to 2 beverages per day.   2. Risk factor reduction:  Advised patient of need for regular exercise and diet rich in fruits and vegetables to reduce risk of heart attack and stroke. Exercise- increase.   Wt Readings from Last 3 Encounters:  09/28/22 (!) 317 lb 2 oz (143.8 kg)  08/21/22 (!) 318 lb 3.2 oz (144.3 kg)  06/08/22 (!) 320 lb (145.2 kg)   3. Immunizations/screenings/ancillary studies Immunization History  Administered Date(s) Administered   Influenza Split 08/18/2012   PFIZER(Purple Top)SARS-COV-2 Vaccination 11/12/2019, 12/03/2019   Tdap 03/02/2022   Health Maintenance Due  Topic Date Due   HIV Screening  Never done   Hepatitis C Screening  Never done    4. Skin cancer screening- Iadvised regular sunscreen use. Denies worrisome, changing, or new skin lesions.  5. Smoking associated screening: non smoker   Problem List Items Addressed This Visit   None Visit Diagnoses     Wellness examination    -  Primary      Wellness-antic guidance.  Rhm UTD.  Reviewed labs from last mo.  F/u 6 mo to check lipids.   Recommended follow up:  [address] in about 6 months (around 03/29/2023) for f/u stomach, wt. No future appointments.   Lab/Order associations:non fasting   ICD-10-CM   1. Wellness examination  Z00.00       No orders of the defined types were placed in this encounter.    AWellington Hampshire MD

## 2022-09-28 NOTE — Patient Instructions (Signed)
It was very nice to see you today!  Keep up the great work on weight.     PLEASE NOTE:  If you had any lab tests please let us know if you have not heard back within a few days. You may see your results on MyChart before we have a chance to review them but we will give you a call once they are reviewed by [place]. If we ordered any referrals today, please let us know if you have not heard from their office within the next week.   Please try these tips to maintain a healthy lifestyle:  Eat most of your calories during the day when you are active. Eliminate processed foods including packaged sweets (pies, cakes, cookies), reduce intake of potatoes, white bread, white pasta, and white rice. Look for whole grain options, oat flour or almond flour.  Each meal should contain half fruits/vegetables, one quarter protein, and one quarter carbs (no bigger than a computer mouse).  Cut down on sweet beverages. This includes juice, soda, and sweet tea. Also watch fruit intake, though this is a healthier sweet option, it still contains natural sugar! Limit to 3 servings daily.  Drink at least 1 glass of water with each meal and aim for at least 8 glasses per day  Exercise at least 150 minutes every week.

## 2022-11-03 ENCOUNTER — Emergency Department (HOSPITAL_BASED_OUTPATIENT_CLINIC_OR_DEPARTMENT_OTHER): Payer: 59

## 2022-11-03 ENCOUNTER — Encounter (HOSPITAL_BASED_OUTPATIENT_CLINIC_OR_DEPARTMENT_OTHER): Payer: Self-pay | Admitting: Emergency Medicine

## 2022-11-03 ENCOUNTER — Emergency Department (HOSPITAL_BASED_OUTPATIENT_CLINIC_OR_DEPARTMENT_OTHER)
Admission: EM | Admit: 2022-11-03 | Discharge: 2022-11-03 | Disposition: A | Discharge status: Home / Self Care | Payer: 59 | Attending: Emergency Medicine | Admitting: Emergency Medicine

## 2022-11-03 ENCOUNTER — Other Ambulatory Visit: Payer: Self-pay

## 2022-11-03 DIAGNOSIS — R079 Chest pain, unspecified: Secondary | ICD-10-CM | POA: Diagnosis not present

## 2022-11-03 DIAGNOSIS — R0789 Other chest pain: Secondary | ICD-10-CM | POA: Diagnosis not present

## 2022-11-03 LAB — CBC WITH DIFFERENTIAL/PLATELET
Abs Immature Granulocytes: 0.03 10*3/uL (ref 0.00–0.07)
Basophils Absolute: 0.1 10*3/uL (ref 0.0–0.1)
Basophils Relative: 1 %
Eosinophils Absolute: 0.2 10*3/uL (ref 0.0–0.5)
Eosinophils Relative: 2 %
HCT: 45 % (ref 39.0–52.0)
Hemoglobin: 14.8 g/dL (ref 13.0–17.0)
Immature Granulocytes: 0 %
Lymphocytes Relative: 45 %
Lymphs Abs: 4.3 10*3/uL — ABNORMAL HIGH (ref 0.7–4.0)
MCH: 26.6 pg (ref 26.0–34.0)
MCHC: 32.9 g/dL (ref 30.0–36.0)
MCV: 80.8 fL (ref 80.0–100.0)
Monocytes Absolute: 0.7 10*3/uL (ref 0.1–1.0)
Monocytes Relative: 8 %
Neutro Abs: 4.1 10*3/uL (ref 1.7–7.7)
Neutrophils Relative %: 44 %
Platelets: 196 10*3/uL (ref 150–400)
RBC: 5.57 MIL/uL (ref 4.22–5.81)
RDW: 14.1 % (ref 11.5–15.5)
WBC: 9.4 10*3/uL (ref 4.0–10.5)
nRBC: 0 % (ref 0.0–0.2)

## 2022-11-03 LAB — BASIC METABOLIC PANEL
Anion gap: 8 (ref 5–15)
BUN: 11 mg/dL (ref 6–20)
CO2: 27 mmol/L (ref 22–32)
Calcium: 10.3 mg/dL (ref 8.9–10.3)
Chloride: 104 mmol/L (ref 98–111)
Creatinine, Ser: 1.18 mg/dL (ref 0.61–1.24)
GFR, Estimated: >60 mL/min (ref >60)
Glucose, Bld: 102 mg/dL — ABNORMAL HIGH (ref 70–99)
Potassium: 3.6 mmol/L (ref 3.5–5.1)
Sodium: 139 mmol/L (ref 135–145)

## 2022-11-03 LAB — TROPONIN I (HIGH SENSITIVITY): Troponin I (High Sensitivity): 2 ng/L (ref <18)

## 2022-11-03 NOTE — ED Notes (Signed)
Pt ambulatory to waiting room. Pt verbalized understanding of discharge instructions.   

## 2022-11-03 NOTE — ED Provider Notes (Signed)
[facility] Provider Note   CSN: [id number] Arrival date & time: 11/03/22  [id number]     History  Chief Complaint  Patient presents with   Chest Pain    Oscar Powers is a 32 y.o. male.  Patient is a 32 year old male with no significant past medical history.  Patient presenting today with complaints of left-sided chest pain.  This has been ongoing for the past 2 days, but became worse this evening and woke him from sleep.  He describes a sharp pain to the left side of his chest that radiates to his neck.  He denies any nausea, shortness of breath, or diaphoresis.  He denies any exertional symptoms.  Patient has no prior cardiac history and no prior cardiac risk factors.  He tells me this is the third time time this has happened in the past 3 years.  The history is provided by the patient.       Home Medications Prior to Admission medications   Medication Sig Start Date End Date Taking? Authorizing Provider  famotidine (PEPCID) 20 MG tablet Take 20 mg by mouth 2 (two) times daily.   Yes [provider]      Allergies    Patient has no known allergies.    Review of Systems   Review of Systems  All other systems reviewed and are negative.   Physical Exam Updated Vital Signs BP 138/83   Temp 98 F (36.7 C)   Resp (!) 21   Wt (!) 137.4 kg   SpO2 100%   BMI 36.88 kg/m  Physical Exam Vitals and nursing note reviewed.  Constitutional:      General: He is not in acute distress.    Appearance: He is well-developed. He is not diaphoretic.  HENT:     Head: Normocephalic and atraumatic.  Cardiovascular:     Rate and Rhythm: Normal rate and regular rhythm.     Heart sounds: No murmur heard.    No friction rub.  Pulmonary:     Effort: Pulmonary effort is normal. No respiratory distress.     Breath sounds: Normal breath sounds. No wheezing or rales.  Abdominal:     General: Bowel sounds are normal. There is no distension.      Palpations: Abdomen is soft.     Tenderness: There is no abdominal tenderness.  Musculoskeletal:        General: Normal range of motion.     Cervical back: Normal range of motion and neck supple.     Right lower leg: No tenderness. No edema.     Left lower leg: No tenderness. No edema.  Skin:    General: Skin is warm and dry.  Neurological:     Mental Status: He is alert and oriented to person, place, and time.     Coordination: Coordination normal.     ED Results / Procedures / Treatments   Labs (all labs ordered are listed, but only abnormal results are displayed) Labs Reviewed - No data to display  EKG EKG Interpretation  Date/Time:  Saturday November 03 2022 05:36:35 EDT Ventricular Rate:  52 PR Interval:  136 QRS Duration: 106 QT Interval:  426 QTC Calculation: 397 R Axis:   202 Text Interpretation: Sinus rhythm Probable right ventricular hypertrophy No significant change since 06/08/2022 Confirmed by Veryl Speak [phone]) on 11/03/2022 5:42:05 AM  Radiology No results found.  Procedures Procedures    Medications Ordered in ED Medications - No data  to display  ED Course/ Medical Decision Making/ A&P  Patient presenting here with complaints of chest pain as described in the HPI.  He arrives here with stable vital signs and is afebrile.  There is no hypoxia.  Physical examination reveals no obvious abnormality.  Workup initiated including CBC, metabolic panel, troponin, all of which were unremarkable.  Chest x-ray shows no acute process.  Patient presenting with atypical chest pain that I suspect is musculoskeletal in nature.  He describes a sharp pain to the left side of the chest and denies any exertional symptoms.  He is having no shortness of breath, nausea, or diaphoresis.  Workup was unremarkable and I feel as though patient can safely be discharged.  I will recommend NSAIDs, rest, and follow-up as needed.  Final Clinical Impression(s) / ED Diagnoses Final  diagnoses:  None    Rx / DC Orders ED Discharge Orders     None         Veryl Speak, MD 11/03/22 [phone]

## 2022-11-03 NOTE — Discharge Instructions (Signed)
Begin taking ibuprofen 600 mg every 6 hours as needed for pain.  Follow-up with primary doctor if not improving in the next week, and return to the ER if symptoms significantly worsen or change.

## 2022-11-03 NOTE — ED Triage Notes (Addendum)
Pt in with sharp L lateral chest/LUQ pain, radiates to L shoulder. Pt states he has had this in the past, and doctor thinks it may have been anxiety or food related.. States he woke up tonight with these symptoms. Denies any sob or nausea

## 2022-11-05 ENCOUNTER — Ambulatory Visit: Payer: 59 | Admitting: Family Medicine

## 2022-11-05 ENCOUNTER — Encounter: Payer: Self-pay | Admitting: Family Medicine

## 2022-11-05 VITALS — BP 118/70 | HR 56 | Temp 98.1°F | Ht 76.0 in | Wt 307.1 lb

## 2022-11-05 DIAGNOSIS — Z8249 Family history of ischemic heart disease and other diseases of the circulatory system: Secondary | ICD-10-CM

## 2022-11-05 DIAGNOSIS — R1012 Left upper quadrant pain: Secondary | ICD-10-CM | POA: Diagnosis not present

## 2022-11-05 DIAGNOSIS — R0789 Other chest pain: Secondary | ICD-10-CM | POA: Diagnosis not present

## 2022-11-05 MED ORDER — OMEPRAZOLE 40 MG PO CPDR: 40.0000 mg | DELAYED_RELEASE_CAPSULE | Freq: Two times a day (BID) | ORAL | 1 refills | Status: DC

## 2022-11-05 NOTE — Patient Instructions (Signed)
[facility]  [address] on the 1st floor Phone number [phone]   Referral to card  Change pepcid to omeprazole  Refer GI

## 2022-11-05 NOTE — Progress Notes (Signed)
Subjective:     Patient ID: Oscar Powers, male    DOB: Jun 03, 1991, 32 y.o.   MRN: [id number]  Chief Complaint  Patient presents with   Shoulder Pain    Left shoulder pain and side pain off and on mostly in afternoon and night started in October 2023   Panic Attack    Woke up with a panick attack on 11/03/22, went to ED due to left shoulder and side pain with nausea    HPI-here w/wife  L shoulder pain and side pain intermitt for yrs. No injury.  Has been to ER-no acute MI.  This is 3rd time in 3 yrs-but second since Oct.  This time felt a little different.  Not food associated. Was there a little for 2-3 days but woke up w/"very aggressive pain" and then tried to work it out and anxiety got worse, and different, some dry heaves, lightleaded, pain in L neck, so to ER.  Starting calming down.  W/u neg.  During day ok, but afternoon and on, worsens.   Other episodes as well last for days-wks.  Taking pepcid bid since oct.  Appetite not good last wk-not wanting to eat lunch.   No dysphagia Panic attack-woke up on 11/03/22 and went to ED.   Health Maintenance Due  Topic Date Due   HIV Screening  Never done   Hepatitis C Screening  Never done    Past Medical History:  Diagnosis Date   Anxiety     History reviewed. No pertinent surgical history.  Outpatient Medications Prior to Visit  Medication Sig Dispense Refill   famotidine (PEPCID) 20 MG tablet Take 20 mg by mouth 2 (two) times daily.     No facility-administered medications prior to visit.    No Known Allergies ROS neg/noncontributory except as noted HPI/below  Bloody stools in past      Objective:     BP 118/70   Pulse (!) 56   Temp 98.1 F (36.7 C) (Temporal)   Ht 6\' 4"  (1.93 m)   Wt (!) 307 lb 2 oz (139.3 kg)   SpO2 97%   BMI 37.38 kg/m  Wt Readings from Last 3 Encounters:  11/05/22 (!) 307 lb 2 oz (139.3 kg)  11/03/22 (!) 303 lb (137.4 kg)  09/28/22 (!) 317 lb 2 oz (143.8 kg)    Physical Exam   Gen:  WDWN NAD HEENT: NCAT, conjunctiva not injected, sclera nonicteric NECK:  supple, no thyromegaly, no nodes, no carotid bruits CARDIAC: brady RRR, S1S2+, no murmur. DP 2+B LUNGS: CTAB. No wheezes ABDOMEN:  BS+, soft, NTND, No HSM, no masses.  When press on LUQ, felt pain in L neck.  EXT:  no edema MSK: no gross abnormalities. Some inconsistent TTP few spots on lateral chest wall.  No TTP spine.  MS 5/5 BUE.   NEURO: A&O x3.  CN II-XII intact.  PSYCH: normal mood. Good eye contact  Reviewed er and other records     Assessment & Plan:   Problem List Items Addressed This Visit   None Visit Diagnoses     Other chest pain    -  Primary   Relevant Orders   Ambulatory referral to Cardiology   Ambulatory referral to Gastroenterology   LUQ pain       Relevant Orders   Ambulatory referral to Gastroenterology   Family history of early CAD       Relevant Orders   Ambulatory referral to Cardiology  Atypical L chest pain.  3 x in 3 yrs but twice in 5 months.  FH CAD-will refer to Card.  May be muscular, abd, scar tissue, other.   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 LUQ pain-?GERD, muscular, cardiac, psychosomatic, other.   To GI.  U/s neg, but got dry heaves other night as well.  Will change pepcid to omeprazole 40mg  bid.   Panic attack?.  Declines meds for now.  Meds ordered this encounter  Medications   omeprazole (PRILOSEC) 40 MG capsule    Sig: Take 1 capsule (40 mg total) by mouth in the morning and at bedtime.    Dispense:  180 capsule    Refill:  1    Wellington Hampshire, MD

## 2022-11-08 ENCOUNTER — Encounter: Payer: Self-pay | Admitting: Sports Medicine

## 2022-11-08 NOTE — Progress Notes (Signed)
  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  Presents with symptoms of electric-like and sharp pain of left side and shoulder. Symptoms present > 1 year. Does report high stress and anxiety. Has been to the ED 2 separate times because thought he was having heart-attack symptoms. Had EKG, labwork, all which were WNL. Previous ED visit with complete abdominal US and no gallstones were appreciated, noted fatty liver.  Symptoms are usually after eating, but have happened outside of this as well. His symptoms are worse with stress. Does admit to a lot of life stressors currently. No nausea, vomiting. No dyspnea on exertion or shortness of breath. He is taking Omeprazole and Pepcid. Currently working on dietary changes. Has lost about 42 lbs over the last year and is eating much healthier. Grandfather had MI in early 50's, no other family cardiac history.   Assessment and Plan:   Left shoulder and flank pain Generalized anxiety disorder with panic attacks Abdominal colic  Discussed with Oscar Powers that his MSK exam is benign. I do not think there are any structural abnormalities of the shoulder or rib cage. I do believe most of his symptoms of somatic in nature from his underlying GAD and stress. He does have some s/s of biliary colic or gallbladder pathology, although previous Abd [place] was non-diagnostic. Do think it would be smart to see psych/counseling here at school and may be of benefit to see GI physician. Recommend he see his PCP, Oscar Powers, about this.   Comprehensive Musculoskeletal Exam:    Cervical: No midline SP TTP, FROM. Negative spurling's. 5/5 strength of Ue's. Shoulder: FROM, 5/5 strength, left shoulder with full rotator cuff strength, negative provocative maneuvers. No scapular dyskinesia.  CV: RRR, normal S1, S2; no MGR Resp: CTAB, full breath sounds Abd: Soft, NT/ND. Negative Murphy's  sign.  Imaging:    N/a  Elba Barman, DO [facility] Team Physician [facility]  This document was dictated using Dragon voice recognition software. A reasonable attempt at proof reading has been made to minimize errors.

## 2022-11-15 ENCOUNTER — Encounter: Payer: Self-pay | Admitting: Family Medicine

## 2022-11-20 ENCOUNTER — Encounter: Payer: Self-pay | Admitting: Physician Assistant

## 2022-11-20 NOTE — Telephone Encounter (Signed)
Spoke to patient and he stated that he is doing okay, pain is under control for now. He is going to call GI and see if he can get an appt.

## 2022-11-29 ENCOUNTER — Telehealth: Payer: 59 | Admitting: Physician Assistant

## 2022-11-29 DIAGNOSIS — B9689 Other specified bacterial agents as the cause of diseases classified elsewhere: Secondary | ICD-10-CM

## 2022-11-29 DIAGNOSIS — J019 Acute sinusitis, unspecified: Secondary | ICD-10-CM

## 2022-11-29 MED ORDER — AMOXICILLIN-POT CLAVULANATE 875-125 MG PO TABS: 1.0000 | ORAL_TABLET | Freq: Two times a day (BID) | ORAL | 0 refills | Status: DC

## 2022-11-29 NOTE — Progress Notes (Signed)

## 2022-11-29 NOTE — Progress Notes (Signed)
I have spent 5 minutes in review of e-visit questionnaire, review and updating patient chart, medical decision making and response to patient.   Alane Hanssen Cody Beryl Balz, PA-C    

## 2022-12-02 DIAGNOSIS — R072 Precordial pain: Secondary | ICD-10-CM | POA: Insufficient documentation

## 2022-12-02 NOTE — Progress Notes (Unsigned)
Cardiology Office Note   Date:  12/03/2022   ID:  Oscar Powers, DOB 02-05-1991, MRN [id number]  PCP:  Jeani Sow, MD  Cardiologist:   None Referring:  Jeani Sow, MD   Chief Complaint  Patient presents with   Chest Pain      History of Present Illness: Oscar Powers is a 32 y.o. male who was referred by Jeani Sow, MD for evaluation of chest pain.  He was in the ED for this in March.  I reviewed these notes for this visit.  He had a couple episodes of chest pain over the years apparently emergency room.  One was about 4 years ago.  This was on New Year's celebrating he had some chest discomfort.  He was thought to have panic attack.  He noticed discomfort over the intervening years often associated with red meat or spicy food.  He would be a left-sided and left shoulder discomfort.  He was having increased symptoms over 6 to 8 months and was again in the emergency room in October of last year.  Again this was thought to be panic.  He changed his diet.  He is lost about 45 pounds.  However, unprovoked he is now starting to have some left upper sharp chest discomfort..  It might be 10/10.  It might radiate to the left side.  It is associated with emotional stress but he can have it happen at rest.  He does not describe associated symptoms.  It might last for hours.  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  He cannot bring his symptoms on with lifting and carrying that he does at his job.   Past Medical History:  Diagnosis Date   Anxiety     History reviewed. No pertinent surgical history.   Current Outpatient Medications  Medication Sig Dispense Refill   amoxicillin-clavulanate (AUGMENTIN) 875-125 MG tablet Take 1 tablet by mouth 2 (two) times daily. 14 tablet 0   famotidine (PEPCID) 20 MG tablet Take 20 mg by mouth 2 (two) times daily.     omeprazole (PRILOSEC) 40 MG capsule Take 1 capsule (40 mg  total) by mouth in the morning and at bedtime. 180 capsule 1   No current facility-administered medications for this visit.    Allergies:   Patient has no known allergies.    Social History:  The patient  reports that he has never smoked. He has never used smokeless tobacco. He reports that he does not currently use alcohol. He reports that he does not currently use drugs.   Family History:  The patient's family history includes Hypertension in his mother.    ROS:  Please see the history of present illness.   Otherwise, review of systems are positive for none.   All other systems are reviewed and negative.    PHYSICAL EXAM: VS:  BP 132/84   Pulse 67   Ht  (1.93 m)   Wt (!) 303 lb 9.6 oz (137.7 kg)   SpO2 99%   BMI 36.96 kg/m  , BMI Body mass index is 36.96 kg/m. GENERAL:  Well appearing HEENT:  Pupils equal round and reactive, fundi not visualized, oral mucosa unremarkable NECK:  No jugular venous distention, waveform within normal limits, carotid upstroke brisk and symmetric, no bruits, no thyromegaly LYMPHATICS:  No cervical, inguinal adenopathy LUNGS:  Clear to auscultation bilaterally BACK:  No CVA  tenderness CHEST:  Unremarkable HEART:  PMI not displaced or sustained,S1 and S2 within normal limits, no S3, no S4, no clicks, no rubs, no murmurs ABD:  Flat, positive bowel sounds normal in frequency in pitch, no bruits, no rebound, no guarding, no midline pulsatile mass, no hepatomegaly, no splenomegaly EXT:  2 plus pulses throughout, no edema, no cyanosis no clubbing SKIN:  No rashes no nodules NEURO:  Cranial nerves II through XII grossly intact, motor grossly intact throughout PSYCH:  Cognitively intact, oriented to person place and time    EKG:  EKG is ordered today. The ekg ordered today demonstrates Sinus rhythm, rate 67, axis within normal limits, intervals within normal limits, no acute ST-T wave changes.   Recent Labs: 08/21/2022: ALT 40; TSH  1.74 11/03/2022: BUN 11; Creatinine, Ser 1.18; Hemoglobin 14.8; Platelets 196; Potassium 3.6; Sodium 139    Lipid Panel No results found for: "CHOL", "TRIG", "HDL", "CHOLHDL", "VLDL", "LDLCALC", "LDLDIRECT"    Wt Readings from Last 3 Encounters:  12/03/22 (!) 303 lb 9.6 oz (137.7 kg)  11/05/22 (!) 307 lb 2 oz (139.3 kg)  11/03/22 (!) 303 lb (137.4 kg)      Other studies Reviewed: Additional studies/ records that were reviewed today include: Labs, ED records Review of the above records demonstrates:  Please see elsewhere in the note.     ASSESSMENT AND PLAN:  Precordial chest pain:  I will bring the patient back for a POET (Plain Old Exercise Test). This will allow me to screen for obstructive coronary disease, risk stratify and very importantly provide a prescription for exercise.  We talked a great deal about risk reduction.   Stress/anxiety:  I suggested some therapies to deal with this.   Risk reduction: I would like to check a TSH, A1c, lipid and LP(a).  All   Current medicines are reviewed at length with the patient today.  The patient does not have concerns regarding medicines.  The following changes have been made:  no change  Labs/ tests ordered today include:   Orders Placed This Encounter  Procedures   Lipid panel   TSH   Hemoglobin A1c   Lipoprotein A (LPA)   EXERCISE TOLERANCE TEST (ETT)   EKG 12-Lead     Disposition:   FU with me as needed based on the results of the above.   Signed, Rollene Rotunda, MD  12/03/2022 9:45 AM    [facility]

## 2022-12-03 ENCOUNTER — Ambulatory Visit: Payer: 59 | Attending: Cardiology | Admitting: Cardiology

## 2022-12-03 ENCOUNTER — Encounter: Payer: Self-pay | Admitting: Cardiology

## 2022-12-03 VITALS — BP 132/84 | HR 67 | Ht 76.0 in | Wt 303.6 lb

## 2022-12-03 DIAGNOSIS — R072 Precordial pain: Secondary | ICD-10-CM | POA: Diagnosis not present

## 2022-12-03 NOTE — Patient Instructions (Signed)
Medication Instructions:  Your physician recommends that you continue on your current medications as directed. Please refer to the Current Medication list given to you today.  *If you need a refill on your cardiac medications before your next appointment, please call your pharmacy*   Lab Work: Your physician recommends that you return for lab work in: in the next week or 2 for FASTING Lipids, LPa, TSH, A1C  If you have labs (blood work) drawn today and your tests are completely normal, you will receive your results only by: MyChart Message (if you have MyChart) OR A paper copy in the mail If you have any lab test that is abnormal or we need to change your treatment, we will call you to review the results.   Testing/Procedures: Your physician has requested that you have an exercise tolerance test.  Please also follow instruction sheet, as given. This will take place at [address], [address] Do not drink or eat foods with caffeine for 24 hours before the test. (Chocolate, coffee, tea, or energy drinks) If you use an inhaler, bring it with you to the test. Do not smoke for 4 hours before the test. Wear comfortable shoes and clothing.    Follow-Up: At [facility], you and your health needs are our priority.  As part of our continuing mission to provide you with exceptional heart care, we have created designated Provider Care Teams.  These Care Teams include your primary Cardiologist (physician) and Advanced Practice Providers (APPs -  Physician Assistants and Nurse Practitioners) who all work together to provide you with the care you need, when you need it.  We recommend signing up for the patient portal called "MyChart".  Sign up information is provided on this After Visit Summary.  MyChart is used to connect with patients for Virtual Visits (Telemedicine).  Patients are able to view lab/test results, encounter notes, upcoming appointments, etc.  Non-urgent messages can be sent  to your provider as well.   To learn more about what you can do with MyChart, go to [email or web address].    Your next appointment:   We will see you on an as needed basis.  Provider:   Rollene Rotunda, MD

## 2022-12-05 LAB — LIPID PANEL
Chol/HDL Ratio: 8.1 ratio — ABNORMAL HIGH (ref 0.0–5.0)
Cholesterol, Total: 242 mg/dL — ABNORMAL HIGH (ref 100–199)
HDL: 30 mg/dL — ABNORMAL LOW (ref >39)
LDL Chol Calc (NIH): 156 mg/dL — ABNORMAL HIGH (ref 0–99)
Triglycerides: 299 mg/dL — ABNORMAL HIGH (ref 0–149)
VLDL Cholesterol Cal: 56 mg/dL — ABNORMAL HIGH (ref 5–40)

## 2022-12-05 LAB — HEMOGLOBIN A1C
Est. average glucose Bld gHb Est-mCnc: 114 mg/dL
Hgb A1c MFr Bld: 5.6 % (ref 4.8–5.6)

## 2022-12-05 LAB — LIPOPROTEIN A (LPA): Lipoprotein (a): 14.9 nmol/L (ref <75.0)

## 2022-12-05 LAB — TSH: TSH: 1.64 u[IU]/mL (ref 0.450–4.500)

## 2022-12-12 ENCOUNTER — Telehealth (HOSPITAL_COMMUNITY): Payer: Self-pay | Admitting: *Deleted

## 2022-12-12 NOTE — Telephone Encounter (Signed)
Patient given detailed instructions per Stress Test Requisition Sheet for test on 12/18/22 at 3:30.  Patient Notified to arrive 30 minutes early, and that it is imperative to arrive on time for appointment to keep from having the test rescheduled.  Patient verbalized understanding. Daneil Dolin

## 2022-12-18 ENCOUNTER — Ambulatory Visit (HOSPITAL_COMMUNITY): Payer: 59 | Attending: Cardiology

## 2022-12-18 DIAGNOSIS — R072 Precordial pain: Secondary | ICD-10-CM | POA: Diagnosis not present

## 2022-12-18 LAB — EXERCISE TOLERANCE TEST
Angina Index: 0
Duke Treadmill Score: 9
Estimated workload: 10.2
Exercise duration (min): 9 min
Exercise duration (sec): 5 s
MPHR: 191 {beats}/min
Peak HR: 173 {beats}/min
Percent HR: 90 %
Rest HR: 62 {beats}/min
ST Depression (mm): 0 mm

## 2022-12-19 ENCOUNTER — Encounter: Payer: Self-pay | Admitting: Family Medicine

## 2022-12-19 ENCOUNTER — Telehealth: Payer: 59 | Admitting: Physician Assistant

## 2022-12-19 DIAGNOSIS — R42 Dizziness and giddiness: Secondary | ICD-10-CM

## 2022-12-19 DIAGNOSIS — K219 Gastro-esophageal reflux disease without esophagitis: Secondary | ICD-10-CM

## 2022-12-19 NOTE — Progress Notes (Signed)
Because of dizziness and weakness associated with the other symptoms, I feel your condition warrants further evaluation and I recommend that you be seen in a face to face visit.   NOTE: There will be NO CHARGE for this eVisit   If you are having a true medical emergency please call 911.      For an urgent face to face visit, [place] has eight urgent care centers for your convenience:   NEW!! [name] Health Urgent Care Center at [facility] Get Driving Directions [phone] [address], [address] [place], [address]    [facility] Health Urgent Care Center at [facility] Get Driving Directions [phone] [address] [place], [place] [address]   [facility] Health Urgent Care Center [facility]) Get Driving Directions [phone] [address] [place], [place] [address]  [facility] Health Urgent Care Center [facility]) Get Driving Directions [phone] [address] [place],  [place]  [address]  [facility] Health Urgent Care Center [facility] - at [employer]  [phone] [address] [place],  [place] [address]   [facility] Health Urgent Care at [facility] Get Driving Directions [phone] [address], [address] [place], [place] [address]   [facility] Health Urgent Care at [facility] Get Driving Directions  [phone] [address] [place], [place] [address]   [facility] Health Urgent Care at [facility] Directions [phone] [address], [address] [place], [place] [address]  Your MyChart E-visit questionnaire answers were reviewed by a board certified advanced clinical practitioner to complete your personal care plan based on your specific symptoms.  Thank you for using e-Visits.

## 2022-12-20 ENCOUNTER — Ambulatory Visit: Payer: 59 | Admitting: Family Medicine

## 2022-12-20 ENCOUNTER — Encounter: Payer: Self-pay | Admitting: Family Medicine

## 2022-12-20 VITALS — BP 122/80 | HR 58 | Temp 97.7°F | Resp 18 | Ht 76.0 in | Wt 290.0 lb

## 2022-12-20 DIAGNOSIS — R1010 Upper abdominal pain, unspecified: Secondary | ICD-10-CM | POA: Diagnosis not present

## 2022-12-20 DIAGNOSIS — K219 Gastro-esophageal reflux disease without esophagitis: Secondary | ICD-10-CM | POA: Diagnosis not present

## 2022-12-20 DIAGNOSIS — R42 Dizziness and giddiness: Secondary | ICD-10-CM

## 2022-12-20 LAB — COMPREHENSIVE METABOLIC PANEL
ALT: 27 U/L (ref 0–53)
AST: 19 U/L (ref 0–37)
Albumin: 4.9 g/dL (ref 3.5–5.2)
Alkaline Phosphatase: 66 U/L (ref 39–117)
BUN: 10 mg/dL (ref 6–23)
CO2: 25 mEq/L (ref 19–32)
Calcium: 9.8 mg/dL (ref 8.4–10.5)
Chloride: 103 mEq/L (ref 96–112)
Creatinine, Ser: 1.19 mg/dL (ref 0.40–1.50)
GFR: 82.37 mL/min (ref >60.00)
Glucose, Bld: 92 mg/dL (ref 70–99)
Potassium: 4.2 mEq/L (ref 3.5–5.1)
Sodium: 139 mEq/L (ref 135–145)
Total Bilirubin: 1.5 mg/dL — ABNORMAL HIGH (ref 0.2–1.2)
Total Protein: 8.1 g/dL (ref 6.0–8.3)

## 2022-12-20 LAB — CBC WITH DIFFERENTIAL/PLATELET
Basophils Absolute: 0.1 10*3/uL (ref 0.0–0.1)
Basophils Relative: 1.1 % (ref 0.0–3.0)
Eosinophils Absolute: 0.2 10*3/uL (ref 0.0–0.7)
Eosinophils Relative: 2.5 % (ref 0.0–5.0)
HCT: 44.5 % (ref 39.0–52.0)
Hemoglobin: 14.8 g/dL (ref 13.0–17.0)
Lymphocytes Relative: 46.4 % — ABNORMAL HIGH (ref 12.0–46.0)
Lymphs Abs: 3.5 10*3/uL (ref 0.7–4.0)
MCHC: 33.3 g/dL (ref 30.0–36.0)
MCV: 81.4 fl (ref 78.0–100.0)
Monocytes Absolute: 0.6 10*3/uL (ref 0.1–1.0)
Monocytes Relative: 7.9 % (ref 3.0–12.0)
Neutro Abs: 3.2 10*3/uL (ref 1.4–7.7)
Neutrophils Relative %: 42.1 % — ABNORMAL LOW (ref 43.0–77.0)
Platelets: 177 10*3/uL (ref 150.0–400.0)
RBC: 5.47 Mil/uL (ref 4.22–5.81)
RDW: 15.2 % (ref 11.5–15.5)
WBC: 7.6 10*3/uL (ref 4.0–10.5)

## 2022-12-20 LAB — H. PYLORI ANTIBODY, IGG: H Pylori IgG: NEGATIVE

## 2022-12-20 LAB — LIPASE: Lipase: 12 U/L (ref 11.0–59.0)

## 2022-12-20 LAB — AMYLASE: Amylase: 23 U/L — ABNORMAL LOW (ref 27–131)

## 2022-12-20 NOTE — Progress Notes (Signed)
Subjective:     Patient ID: Oscar Powers, male    DOB: 28-Dec-1990, 32 y.o.   MRN: [id number]  Chief Complaint  Patient presents with   Gastroesophageal Reflux    Started having heartburn again 2 days after eating "bad" food, more noticeable at night, causing dizziness and headache     HPI GERD-had been ok w/Pepcid twice daily and omeprazole twice daily.  So started some dietary indiscretions.  Then worse.  Ate "bad food"-biscuit king and bad. Symptoms more at night.  First night, Was getting pain left shoulder and achey all over and hard to sleep,  second night, got dizziness and headache(s). Last eve, took melatonin and slept better.   Getting some pain right leg behind knee-at night and ant upper thigh.  Flares at night.  Feeling some dizzy now-dietary changes-cheerios for breakfast, fruit for lunch and 10g protein bar, protein/veges supper.   Stress test yesterday-no results Saw cholesterol results so taking cholestoff and fish oil three times daily since last week(s).  Started walking 2 days ago.  FH CAD mgpa-40's.  Health Maintenance Due  Topic Date Due   HIV Screening  Never done   Hepatitis C Screening  Never done    Past Medical History:  Diagnosis Date   Anxiety     History reviewed. No pertinent surgical history.   Current Outpatient Medications:    famotidine (PEPCID) 20 MG tablet, Take 20 mg by mouth 2 (two) times daily., Disp: , Rfl:    omeprazole (PRILOSEC) 40 MG capsule, Take 1 capsule (40 mg total) by mouth in the morning and at bedtime., Disp: 180 capsule, Rfl: 1  No Known Allergies ROS neg/noncontributory except as noted HPI/below      Objective:     BP 122/80   Pulse (!) 58   Temp 97.7 F (36.5 C) (Temporal)   Resp 18   Ht 6\' 4"  (1.93 m)   Wt 290 lb (131.5 kg)   SpO2 97%   BMI 35.30 kg/m  Wt Readings from Last 3 Encounters:  12/20/22 290 lb (131.5 kg)  12/03/22 (!) 303 lb 9.6 oz (137.7 kg)  11/05/22 (!) 307 lb 2 oz (139.3 kg)     Physical Exam   Gen: WDWN NAD HEENT: NCAT, conjunctiva not injected, sclera nonicteric NECK:  supple, no thyromegaly, no nodes, no carotid bruits CARDIAC: RRR, S1S2+, no murmur. DP 2+B LUNGS: CTAB. No wheezes ABDOMEN:  BS+, soft, slightly tender mid epi No HSM, no masses EXT:  no edema MSK: no gross abnormalities.  NEURO: A&O x3.  CN II-XII intact.  PSYCH: normal mood. Good eye contact  Reviewed Card notes, previous labs/studies    Assessment & Plan:  Pain of upper abdomen -     Comprehensive metabolic panel -     CBC with Differential/Platelet -     Lipase -     Amylase -     H. pylori antibody, IgG -     NM Hepato W/EF; Future  Gastroesophageal reflux disease without esophagitis  Dizziness   Pain upper abd not sure if just GERD, pancreas, atypical gallbladder, other.  Will check CBC, CMP, amylase, lipase, H. pylori antibody.  Will get HIDA scan of the gallbladder as this seems to be happening after fatty meals.  Continue to work on diet. GERD-flared from poor dietary habits.  Not sure if gallbladder is causing a problem.  He is currently on 40 mg omeprazole twice daily and Pepcid 20 mg twice daily.  This is  not holding.  He does have an appointment with GI at the end of the month.  Continue meds, avoid dietary indiscretions Dizziness-not sure if this is stemming from reaction to poor diet, sugar withdrawals due to new dietary habits, other.  For now, advised to increase protein content in his diet (I do not think he is getting near enough) monitor  Angelena Sole, MD

## 2022-12-20 NOTE — Patient Instructions (Addendum)
It was very nice to see you today!  More protein in diet.  Ordered HIDA of the liver.     PLEASE NOTE:  If you had any lab tests please let us know if you have not heard back within a few days. You may see your results on MyChart before we have a chance to review them but we will give you a call once they are reviewed by [place]. If we ordered any referrals today, please let us know if you have not heard from their office within the next week.   Please try these tips to maintain a healthy lifestyle:  Eat most of your calories during the day when you are active. Eliminate processed foods including packaged sweets (pies, cakes, cookies), reduce intake of potatoes, white bread, white pasta, and white rice. Look for whole grain options, oat flour or almond flour.  Each meal should contain half fruits/vegetables, one quarter protein, and one quarter carbs (no bigger than a computer mouse).  Cut down on sweet beverages. This includes juice, soda, and sweet tea. Also watch fruit intake, though this is a healthier sweet option, it still contains natural sugar! Limit to 3 servings daily.  Drink at least 1 glass of water with each meal and aim for at least 8 glasses per day  Exercise at least 150 minutes every week.

## 2022-12-21 NOTE — Progress Notes (Signed)
Normal / stable labs.

## 2023-01-08 ENCOUNTER — Ambulatory Visit: Payer: 59 | Admitting: Physician Assistant

## 2023-01-11 NOTE — Progress Notes (Deleted)
01/11/2023 Oscar Powers [id number] February 19, 1991  Referring provider: Jeani Sow, MD Primary GI doctor: {acdocs:27040}  ASSESSMENT AND PLAN:   There are no diagnoses linked to this encounter.   Patient Care Team: Jeani Sow, MD as PCP - General (Family Medicine)  HISTORY OF PRESENT ILLNESS: 32 y.o. male with a past medical history of GERD and others listed below presents for evaluation of atypical chest pain.   Patient's had chest pain over several years, occasional ER visits for this. Will have left sided upper chest pain and left shoulder discomfort. Can be associate with red meat or spicy foods. Most recent ER visit was 11/03/2022 patient had unremarkable chest x-ray, troponin.  CBC without leukocytosis or anemia.  Hemoglobin 14.8.  Normal kidney He is lost over 45 pounds with changing his diet. Patient had office visit with cardiology, had an unremarkable stress test.  Negative lipoprotein a.  No diabetes.  Did have elevated cholesterol. 09/11/2022 abdominal ultrasound with primary care showed hepatic steatosis limited valuation for hepatic masses due to decreased penetration and body habitus.  No gallstones or wall thickening. 12/20/2022 liver function shows total bilirubin 1.5 unremarkable alk phos, AST ALT.  Normal kidney function.  CBC unremarkable.  Normal lipase, amylase low at 23.  Negative H. pylori IgG antibody Patient {Actions; denies-reports:120008} family history of colon cancer or other gastrointestinal malignancies.    He {Actions; denies-reports:120008} blood thinner use.  He {Actions; denies-reports:120008} NSAID use.  He {Actions; denies-reports:120008} ETOH use.   He {Actions; denies-reports:120008} tobacco use.  He {Actions; denies-reports:120008} drug use.    He  reports that he has never smoked. He has never used smokeless tobacco. He reports that he does not currently use alcohol. He reports that he does not currently use drugs.  RELEVANT  LABS AND IMAGING: CBC    Component Value Date/Time   WBC 7.6 12/20/2022 0937   RBC 5.47 12/20/2022 0937   HGB 14.8 12/20/2022 0937   HCT 44.5 12/20/2022 0937   PLT 177.0 12/20/2022 0937   MCV 81.4 12/20/2022 0937   MCH 26.6 11/03/2022 0605   MCHC 33.3 12/20/2022 0937   RDW 15.2 12/20/2022 0937   LYMPHSABS 3.5 12/20/2022 0937   MONOABS 0.6 12/20/2022 0937   EOSABS 0.2 12/20/2022 0937   BASOSABS 0.1 12/20/2022 0937   Recent Labs    06/08/22 1120 08/21/22 0907 11/03/22 0605 12/20/22 0937  HGB 15.3 15.1 14.8 14.8    CMP     Component Value Date/Time   NA 139 12/20/2022 0937   K 4.2 12/20/2022 0937   CL 103 12/20/2022 0937   CO2 25 12/20/2022 0937   GLUCOSE 92 12/20/2022 0937   BUN 10 12/20/2022 0937   CREATININE 1.19 12/20/2022 0937   CALCIUM 9.8 12/20/2022 0937   PROT 8.1 12/20/2022 0937   ALBUMIN 4.9 12/20/2022 0937   AST 19 12/20/2022 0937   ALT 27 12/20/2022 0937   ALKPHOS 66 12/20/2022 0937   BILITOT 1.5 (H) 12/20/2022 0937   GFRNONAA >60 11/03/2022 0605   GFRAA >60 08/21/2019 0342      Latest Ref Rng & Units 12/20/2022    9:37 AM 08/21/2022    9:07 AM 06/08/2022   11:20 AM  Hepatic Function  Total Protein 6.0 - 8.3 g/dL 8.1  8.0  8.6   Albumin 3.5 - 5.2 g/dL 4.9  4.8  4.8   AST 0 - 37 U/L 19  23  24    ALT 0 - 53 U/L 27  40  30   Alk Phosphatase 39 - 117 U/L 66  70  57   Total Bilirubin 0.2 - 1.2 mg/dL 1.5  0.9  1.2       Current Medications:        Current Outpatient Medications (Other):    famotidine (PEPCID) 20 MG tablet, Take 20 mg by mouth 2 (two) times daily.   omeprazole (PRILOSEC) 40 MG capsule, Take 1 capsule (40 mg total) by mouth in the morning and at bedtime.  Medical History:  Past Medical History:  Diagnosis Date   Anxiety    Allergies: No Known Allergies   Surgical History:  He  has no past surgical history on file. Family History:  His family history includes Hypertension in his mother.  REVIEW OF SYSTEMS  : All other  systems reviewed and negative except where noted in the History of Present Illness.  PHYSICAL EXAM: There were no vitals taken for this visit. General Appearance: Well nourished, in no apparent distress. Head:   Normocephalic and atraumatic. Eyes:  sclerae anicteric,conjunctive pink  Respiratory: Respiratory effort normal, BS equal bilaterally without rales, rhonchi, wheezing. Cardio: RRR with no MRGs. Peripheral pulses intact.  Abdomen: Soft,  {BlankSingle:19197::"Flat","Obese","Non-distended"} ,active bowel sounds. {actendernessAB:27319} tenderness {anatomy; site abdomen:5010}. {BlankMultiple:19196::"Without guarding","With guarding","Without rebound","With rebound"}. No masses. Rectal: {acrectalexam:27461} Musculoskeletal: Full ROM, {PSY - GAIT AND STATION:22860} gait. {With/Without:304960234} edema. Skin:  Dry and intact without significant lesions or rashes Neuro: Alert and  oriented x4;  No focal deficits. Psych:  Cooperative. Normal mood and affect.    Doree Albee, PA-C 8:08 AM

## 2023-01-15 ENCOUNTER — Ambulatory Visit: Payer: 59 | Admitting: Physician Assistant

## 2023-01-18 ENCOUNTER — Encounter (HOSPITAL_COMMUNITY)
Admission: RE | Admit: 2023-01-18 | Discharge: 2023-01-18 | Disposition: A | Discharge status: Home / Self Care | Payer: 59 | Source: Ambulatory Visit | Attending: Family Medicine | Admitting: Family Medicine

## 2023-01-18 DIAGNOSIS — R1010 Upper abdominal pain, unspecified: Secondary | ICD-10-CM | POA: Insufficient documentation

## 2023-01-18 DIAGNOSIS — R109 Unspecified abdominal pain: Secondary | ICD-10-CM | POA: Diagnosis not present

## 2023-01-18 MED ORDER — TECHNETIUM TC 99M MEBROFENIN IV KIT
5.1700 | PACK | Freq: Once | INTRAVENOUS | Status: AC
  Administered 2023-01-18: 5.17 via INTRAVENOUS

## 2023-01-18 NOTE — Progress Notes (Unsigned)
01/21/2023 Oscar Powers [id number] 05-12-1991  Referring provider: Jeani Sow, MD Primary GI doctor: Dr. Rhea Belton  ASSESSMENT AND PLAN:   Atypical chest pain Has seen cardiology with unremarkable work up Worse after food recent normal CBC/CMET/TSH/lipase, AB [place] and HIDA No dysphagia, no melena, no ETOH, no NSAIDS Continue omepraozle 40 mg BID Check celiac, check inflammatory markers Will schedule EGD. I discussed risks of EGD with patient today, including risk of sedation, bleeding or perforation.  Patient provides understanding and gave verbal consent to proceed. If unremarkable likely related to stress/anxiety and suggest further discussion with PCP  Rectal bleeding, loss of weight, mixed diarrhea/constipation Has lost about 45-50 lbs since Oct, some has been change in diet, but states some has been unintentional.  Check celiac, inflammatory markers Will schedule for colonoscopy with the EGD to evaluate for crohn's/UC We have discussed the risks of bleeding, infection, perforation, medication reactions, and remote risk of death associated with colonoscopy. All questions were answered and the patient acknowledges these risk and wishes to proceed.  Fatty liver --Continue to work on risk factor modification including diet exercise and control of risk factors including blood sugars. - monitor q 6 months.    Patient Care Team: Jeani Sow, MD as PCP - General (Family Medicine)  HISTORY OF PRESENT ILLNESS: 32 y.o. male with a past medical history of GERD and others listed below presents for evaluation of atypical chest pain.   Patient's had chest pain over several years, occasional ER visits for this. Will have left sided upper chest pain and left shoulder discomfort. Can be associate with red meat or spicy foods. Most recent ER visit was 11/03/2022 patient had unremarkable chest x-ray, troponin.  CBC without leukocytosis or anemia.  Hemoglobin 14.8.  Normal  kidney  Patient had office visit with cardiology, had an unremarkable stress test.  Negative lipoprotein a.  No diabetes.  Did have elevated cholesterol. 09/11/2022 abdominal ultrasound with primary care showed hepatic steatosis limited valuation for hepatic masses due to decreased penetration and body habitus.  No gallstones or wall thickening. 12/20/2022 liver function shows total bilirubin 1.5 unremarkable alk phos, AST ALT.  Normal kidney function.  CBC unremarkable.  Normal lipase, amylase low at 23.  Negative H. pylori IgG antibody 01/18/2023 HIDA unremarkable.   States 4-5 years ago had panic attacks, racing thoughts, and left upper shoulder pain which felt like MI.  Since that time, stopped ETOH, was rare prior but stopped.  He continues to have panic attacks and left upper chest pain.  Pain is intermittent but has been worse this past year.  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  Has 89 month old daughter Oscar Powers, step dad with prostate cancer, coninues to emphasize he has a lot of stress.  He denies overt GERD, he has been on pepcid and omeprazole 40 mg twice daily since April, and may have helped decrease severity of the chest pain he is feeling. Mylanta was helping but stopped, he is on gavescon. That helps. Chest pain worse after food and worse last in the day.  He is lost over 45 pounds with changing his diet since Oct, he has had some diarrhea with this as well but this has resolved.  Has one episode of BRB in the stool, after hard stool and rectal burning, has not had in a long time.  Denies dysphagia, melena.   Patient denies family history of colon cancer or other  gastrointestinal malignancies. Denies family history of autoimmune.   He denies blood thinner use.  He denies NSAID use.  He denies ETOH use.   He denies tobacco use.  He denies drug use.    He  reports that he has never smoked. He has never used  smokeless tobacco. He reports that he does not currently use alcohol. He reports that he does not currently use drugs.  RELEVANT LABS AND IMAGING: CBC    Component Value Date/Time   WBC 7.6 12/20/2022 0937   RBC 5.47 12/20/2022 0937   HGB 14.8 12/20/2022 0937   HCT 44.5 12/20/2022 0937   PLT 177.0 12/20/2022 0937   MCV 81.4 12/20/2022 0937   MCH 26.6 11/03/2022 0605   MCHC 33.3 12/20/2022 0937   RDW 15.2 12/20/2022 0937   LYMPHSABS 3.5 12/20/2022 0937   MONOABS 0.6 12/20/2022 0937   EOSABS 0.2 12/20/2022 0937   BASOSABS 0.1 12/20/2022 0937   Recent Labs    06/08/22 1120 08/21/22 0907 11/03/22 0605 12/20/22 0937  HGB 15.3 15.1 14.8 14.8    CMP     Component Value Date/Time   NA 139 12/20/2022 0937   K 4.2 12/20/2022 0937   CL 103 12/20/2022 0937   CO2 25 12/20/2022 0937   GLUCOSE 92 12/20/2022 0937   BUN 10 12/20/2022 0937   CREATININE 1.19 12/20/2022 0937   CALCIUM 9.8 12/20/2022 0937   PROT 8.1 12/20/2022 0937   ALBUMIN 4.9 12/20/2022 0937   AST 19 12/20/2022 0937   ALT 27 12/20/2022 0937   ALKPHOS 66 12/20/2022 0937   BILITOT 1.5 (H) 12/20/2022 0937   GFRNONAA >60 11/03/2022 0605   GFRAA >60 08/21/2019 0342      Latest Ref Rng & Units 12/20/2022    9:37 AM 08/21/2022    9:07 AM 06/08/2022   11:20 AM  Hepatic Function  Total Protein 6.0 - 8.3 g/dL 8.1  8.0  8.6   Albumin 3.5 - 5.2 g/dL 4.9  4.8  4.8   AST 0 - 37 U/L 19  23  24    ALT 0 - 53 U/L 27  40  30   Alk Phosphatase 39 - 117 U/L 66  70  57   Total Bilirubin 0.2 - 1.2 mg/dL 1.5  0.9  1.2       Current Medications:        Current Outpatient Medications (Other):    famotidine (PEPCID) 20 MG tablet, Take 20 mg by mouth 2 (two) times daily.   omeprazole (PRILOSEC) 40 MG capsule, Take 1 capsule (40 mg total) by mouth in the morning and at bedtime.  Medical History:  Past Medical History:  Diagnosis Date   Anxiety    Allergies: No Known Allergies   Surgical History:  He  has no past  surgical history on file. Family History:  His family history includes Hypertension in his mother.  REVIEW OF SYSTEMS  : All other systems reviewed and negative except where noted in the History of Present Illness.  PHYSICAL EXAM: BP 130/80   Pulse 71   Ht 6\' 4"  (1.93 m)   Wt 292 lb 8 oz (132.7 kg)   BMI 35.60 kg/m  General Appearance: Well nourished, in no apparent distress. Head:   Normocephalic and atraumatic. Eyes:  sclerae anicteric,conjunctive pink  Respiratory: Respiratory effort normal, BS equal bilaterally without rales, rhonchi, wheezing. Cardio: RRR with no MRGs. Peripheral pulses intact.  Abdomen: Soft,  Obese ,active bowel sounds. mild tenderness in the  epigastrium. Without guarding and Without rebound. No masses. Rectal: Not evaluated Musculoskeletal: Full ROM, Normal gait. Without edema. Skin:  Dry and intact without significant lesions or rashes Neuro: Alert and  oriented x4;  No focal deficits. Psych:  Cooperative. Normal mood and affect.    Doree Albee, PA-C 10:48 AM

## 2023-01-21 ENCOUNTER — Ambulatory Visit: Payer: 59 | Admitting: Physician Assistant

## 2023-01-21 ENCOUNTER — Encounter: Payer: Self-pay | Admitting: Physician Assistant

## 2023-01-21 ENCOUNTER — Other Ambulatory Visit (INDEPENDENT_AMBULATORY_CARE_PROVIDER_SITE_OTHER): Payer: 59

## 2023-01-21 VITALS — BP 130/80 | HR 71 | Ht 76.0 in | Wt 292.5 lb

## 2023-01-21 DIAGNOSIS — R634 Abnormal weight loss: Secondary | ICD-10-CM | POA: Diagnosis not present

## 2023-01-21 DIAGNOSIS — R0789 Other chest pain: Secondary | ICD-10-CM | POA: Diagnosis not present

## 2023-01-21 DIAGNOSIS — K625 Hemorrhage of anus and rectum: Secondary | ICD-10-CM

## 2023-01-21 DIAGNOSIS — K760 Fatty (change of) liver, not elsewhere classified: Secondary | ICD-10-CM

## 2023-01-21 LAB — SEDIMENTATION RATE: Sed Rate: 11 mm/hr (ref 0–15)

## 2023-01-21 LAB — HIGH SENSITIVITY CRP: CRP, High Sensitivity: 10.96 mg/L — ABNORMAL HIGH (ref 0.000–5.000)

## 2023-01-21 MED ORDER — NA SULFATE-K SULFATE-MG SULF 17.5-3.13-1.6 GM/177ML PO SOLN: 1.0000 | Freq: Once | ORAL | 0 refills | Status: AC

## 2023-01-21 NOTE — Progress Notes (Signed)
Addendum: Reviewed and agree with assessment and management plan. Froylan Hobby M, MD  

## 2023-01-21 NOTE — Patient Instructions (Addendum)
Your provider has requested that you go to the basement level for lab work before leaving today. Press "B" on the elevator. The lab is located at the first door on the left as you exit the elevator.  You have been scheduled for a colonoscopy/EGD. Please follow written instructions given to you at your visit today.  Please pick up your prep supplies at the pharmacy within the next 1-3 days. If you use inhalers (even only as needed), please bring them with you on the day of your procedure.   Silent reflux: Not all heartburn burns...Marland KitchenMarland KitchenMarland Kitchen  What is LPR? Laryngopharyngeal reflux (LPR) or silent reflux is a condition in which acid that is made in the stomach travels up the esophagus (swallowing tube) and gets to the throat. Not everyone with reflux has a lot of heartburn or indigestion. In fact, many people with LPR never have heartburn. This is why LPR is called SILENT REFLUX, and the terms "Silent reflux" and "LPR" are often used interchangeably. Because LPR is silent, it is sometimes difficult to diagnose.  How can you tell if you have LPR?  Chronic hoarseness- Some people have hoarseness that comes and goes throat clearing  Cough It can cause shortness of breath and cause asthma like symptoms. a feeling of a lump in the throat  difficulty swallowing a problem with too much nose and throat drainage.  Some people will feel their esophagus spasm which feels like their heart beating hard and fast, this will usually be after a meal, at rest, or lying down at night.    How do I treat this? Treatment for LPR should be individualized, and your doctor will suggest the best treatment for you. Generally there are several treatments for LPR: changing habits and diet to reduce reflux,  medications to reduce stomach acid, and  surgery to prevent reflux. Most people with LPR need to modify how and when they eat, as well as take some medication, to get well. Sometimes, nonprescription liquid antacids, such  as Maalox, Gelucil and Mylanta are recommended. When used, these antacids should be taken four times each day - one tablespoon one hour after each meal and before bedtime. Dietary and lifestyle changes alone are not often enough to control LPR - medications that reduce stomach acid are also usually needed. These must be prescribed by our doctor.   TIPS FOR REDUCING REFLUX AND LPR Control your LIFE-STYLE and your DIET! If you use tobacco, QUIT.  Smoking makes you reflux. After every cigarette you have some LPR.  Don't wear clothing that is too tight, especially around the waist (trousers, corsets, belts).  Do not lie down just after eating...in fact, do not eat within three hours of bedtime.  You should be on a low-fat diet.  Limit your intake of red meat.  Limit your intake of butter.  Avoid fried foods.  Avoid chocolate  Avoid cheese.  Avoid eggs. Specifically avoid caffeine (especially coffee and tea), soda pop (especially cola) and mints.  Avoid alcoholic beverages, particularly in the evening.  _______________________________________________________  If your blood pressure at your visit was 140/90 or greater, please contact your primary care physician to follow up on this.  _______________________________________________________  If you are age 27 or older, your body mass index should be between 23-30. Your Body mass index is 35.6 kg/m. If this is out of the aforementioned range listed, please consider follow up with your Primary Care Provider.  If you are age 60 or younger, your body mass index  should be between 19-25. Your Body mass index is 35.6 kg/m. If this is out of the aformentioned range listed, please consider follow up with your Primary Care Provider.   ________________________________________________________  The [facility] providers would like to encourage you to use [facility] to communicate with providers for non-urgent requests or questions.  Due to long hold  times on the telephone, sending your provider a message by [facility] may be a faster and more efficient way to get a response.  Please allow 48 business hours for a response.  Please remember that this is for non-urgent requests.  _______________________________________________________ It was a pleasure to see you today!  Thank you for trusting me with your gastrointestinal care!

## 2023-01-22 LAB — IGA: Immunoglobulin A: 225 mg/dL (ref 47–310)

## 2023-01-22 LAB — TISSUE TRANSGLUTAMINASE, IGA: (tTG) Ab, IgA: <1 U/mL

## 2023-03-18 ENCOUNTER — Other Ambulatory Visit: Payer: 59

## 2023-03-18 ENCOUNTER — Encounter: Payer: Self-pay | Admitting: Internal Medicine

## 2023-03-18 ENCOUNTER — Ambulatory Visit (AMBULATORY_SURGERY_CENTER): Payer: 59 | Admitting: Internal Medicine

## 2023-03-18 VITALS — BP 102/67 | HR 64 | Temp 97.1°F | Resp 16 | Ht 76.0 in | Wt 270.0 lb

## 2023-03-18 DIAGNOSIS — R194 Change in bowel habit: Secondary | ICD-10-CM

## 2023-03-18 DIAGNOSIS — K639 Disease of intestine, unspecified: Secondary | ICD-10-CM | POA: Diagnosis not present

## 2023-03-18 DIAGNOSIS — R634 Abnormal weight loss: Secondary | ICD-10-CM

## 2023-03-18 DIAGNOSIS — K625 Hemorrhage of anus and rectum: Secondary | ICD-10-CM | POA: Diagnosis not present

## 2023-03-18 DIAGNOSIS — R0789 Other chest pain: Secondary | ICD-10-CM | POA: Diagnosis not present

## 2023-03-18 DIAGNOSIS — K229 Disease of esophagus, unspecified: Secondary | ICD-10-CM | POA: Diagnosis not present

## 2023-03-18 DIAGNOSIS — K219 Gastro-esophageal reflux disease without esophagitis: Secondary | ICD-10-CM

## 2023-03-18 DIAGNOSIS — K2950 Unspecified chronic gastritis without bleeding: Secondary | ICD-10-CM | POA: Diagnosis not present

## 2023-03-18 HISTORY — PX: COLONOSCOPY WITH ESOPHAGOGASTRODUODENOSCOPY (EGD): SHX5779

## 2023-03-18 MED ORDER — SODIUM CHLORIDE 0.9 % IV SOLN: 500.0000 mL | Freq: Once | INTRAVENOUS | Status: DC

## 2023-03-18 NOTE — Op Note (Signed)
[facility] Patient Name: Oscar Powers Procedure Date: 03/18/2023 3:17 PM MRN: [id number] Endoscopist: Beverley Fiedler , MD, [id number] Age: 32 Referring MD:  Date of Birth: Dec 20, 1990 Gender: Male Account #: [id number] Procedure:                Upper GI endoscopy Indications:              Chest pain (non cardiac), weight loss, altered                            bowel function, elevated CRP, current therapy                            omeprazole 40 mg BID, liquid Gaviscon is the most                            effective Medicines:                Monitored Anesthesia Care Procedure:                Pre-Anesthesia Assessment:                           - Prior to the procedure, a History and Physical                            was performed, and patient medications and                            allergies were reviewed. The patient's tolerance of                            previous anesthesia was also reviewed. The risks                            and benefits of the procedure and the sedation                            options and risks were discussed with the patient.                            All questions were answered, and informed consent                            was obtained. Prior Anticoagulants: The patient has                            taken no anticoagulant or antiplatelet agents. ASA                            Grade Assessment: II - A patient with mild systemic                            disease. After reviewing the risks and benefits,  the patient was deemed in satisfactory condition to                            undergo the procedure.                           After obtaining informed consent, the endoscope was                            passed under direct vision. Throughout the                            procedure, the patient's blood pressure, pulse, and                            oxygen saturations were monitored continuously. The                             GIF HQ190 #[id number] was introduced through the                            mouth, and advanced to the second part of duodenum.                            The upper GI endoscopy was accomplished without                            difficulty. The patient tolerated the procedure                            well. Scope In: Scope Out: Findings:                 The examined esophagus was normal. Biopsies were                            obtained from the proximal and distal esophagus                            with cold forceps for histology and to evaluate for                            uncontrolled acid reflux and exclusion of                            eosinophilic esophagitis.                           The entire examined stomach was normal. Biopsies                            were taken with a cold forceps for histology and                            Helicobacter pylori testing.  The examined duodenum was normal. Biopsies for                            histology were taken with a cold forceps for                            evaluation of celiac disease. Complications:            No immediate complications. Estimated Blood Loss:     Estimated blood loss was minimal. Impression:               - Normal esophagus.                           - Normal stomach. Biopsied.                           - Normal examined duodenum. Biopsied.                           - Biopsies were taken with a cold forceps for                            evaluation of reflux and eosinophilic esophagitis. Recommendation:           - Patient has a contact number available for                            emergencies. The signs and symptoms of potential                            delayed complications were discussed with the                            patient. Return to normal activities tomorrow.                            Written discharge instructions were provided to the                             patient.                           - Resume previous diet.                           - Continue present medications.                           - Await pathology results.                           - See the other procedure note for documentation of                            additional recommendations. Beverley Fiedler, MD 03/18/2023 3:52:56 PM This report has been signed electronically.

## 2023-03-18 NOTE — Progress Notes (Unsigned)
Pt's states no medical or surgical changes since previsit or office visit. 

## 2023-03-18 NOTE — Progress Notes (Signed)
Uneventful anesthetic. Report to pacu rn. Vss. Care resumed by rn. 

## 2023-03-18 NOTE — Progress Notes (Unsigned)
Called to room to assist during endoscopic procedure.  Patient ID and intended procedure confirmed with present staff. Received instructions for my participation in the procedure from the performing physician.  

## 2023-03-18 NOTE — Op Note (Signed)
[facility] Patient Name: Oscar Powers Procedure Date: 03/18/2023 3:07 PM MRN: [id number] Endoscopist: Beverley Fiedler , MD, [id number] Age: 32 Referring MD:  Date of Birth: Nov 19, 1990 Gender: Male Account #: [id number] Procedure:                Colonoscopy Indications:              Rectal bleeding (none of late), Change in bowel                            habits, Weight loss Medicines:                Monitored Anesthesia Care Procedure:                Pre-Anesthesia Assessment:                           - Prior to the procedure, a History and Physical                            was performed, and patient medications and                            allergies were reviewed. The patient's tolerance of                            previous anesthesia was also reviewed. The risks                            and benefits of the procedure and the sedation                            options and risks were discussed with the patient.                            All questions were answered, and informed consent                            was obtained. Prior Anticoagulants: The patient has                            taken no anticoagulant or antiplatelet agents. ASA                            Grade Assessment: II - A patient with mild systemic                            disease. After reviewing the risks and benefits,                            the patient was deemed in satisfactory condition to                            undergo the procedure.  After obtaining informed consent, the colonoscope                            was passed under direct vision. Throughout the                            procedure, the patient's blood pressure, pulse, and                            oxygen saturations were monitored continuously. The                            Olympus CF-[id number] ([id number]) Colonoscope was                            introduced through the anus and advanced to the                             terminal ileum. The colonoscopy was performed                            without difficulty. The patient tolerated the                            procedure well. The quality of the bowel                            preparation was excellent. The terminal ileum,                            ileocecal valve, appendiceal orifice, and rectum                            were photographed. Scope In: 3:37:31 PM Scope Out: 3:48:40 PM Scope Withdrawal Time: 0 hours 8 minutes 57 seconds  Total Procedure Duration: 0 hours 11 minutes 9 seconds  Findings:                 The digital rectal exam was normal.                           The terminal ileum appeared normal.                           The entire examined colon appeared normal on direct                            and retroflexion views. Complications:            No immediate complications. Estimated Blood Loss:     Estimated blood loss: none. Impression:               - The examined portion of the ileum was normal.                           - The entire examined colon is normal on  direct and                            retroflexion views.                           - No specimens collected. Recommendation:           - Patient has a contact number available for                            emergencies. The signs and symptoms of potential                            delayed complications were discussed with the                            patient. Return to normal activities tomorrow.                            Written discharge instructions were provided to the                            patient.                           - Resume previous diet.                           - Continue present medications.                           - Check alpha-gal panel.                           - Repeat colonoscopy at age 44 for screening                            purposes. Beverley Fiedler, MD 03/18/2023 3:55:11 PM This report has been signed  electronically.

## 2023-03-18 NOTE — Patient Instructions (Addendum)
- Return to normal activities tomorrow.  - Written discharge instructions were provided to the patient. - Resume previous diet. - Continue present medications. - Check alpha-gal panel. - Repeat colonoscopy at age 32 for screening purposes.  YOU HAD AN ENDOSCOPIC PROCEDURE TODAY AT THE [facility]:   Refer to the procedure report that was given to you for any specific questions about what was found during the examination.  If the procedure report does not answer your questions, please call your gastroenterologist to clarify.  If you requested that your care partner not be given the details of your procedure findings, then the procedure report has been included in a sealed envelope for you to review at your convenience later.  YOU SHOULD EXPECT: Some feelings of bloating in the abdomen. Passage of more gas than usual.  Walking can help get rid of the air that was put into your GI tract during the procedure and reduce the bloating. If you had a lower endoscopy (such as a colonoscopy or flexible sigmoidoscopy) you may notice spotting of blood in your stool or on the toilet paper. If you underwent a bowel prep for your procedure, you may not have a normal bowel movement for a few days.  Please Note:  You might notice some irritation and congestion in your nose or some drainage.  This is from the oxygen used during your procedure.  There is no need for concern and it should clear up in a day or so.  SYMPTOMS TO REPORT IMMEDIATELY:  Following lower endoscopy (colonoscopy or flexible sigmoidoscopy):  Excessive amounts of blood in the stool  Significant tenderness or worsening of abdominal pains  Swelling of the abdomen that is new, acute  Fever of 100F or higher  Following upper endoscopy (EGD)  Vomiting of blood or coffee ground material  New chest pain or pain under the shoulder blades  Painful or persistently difficult swallowing  New shortness of breath  Fever of 100F or  higher  Black, tarry-looking stools  For urgent or emergent issues, a gastroenterologist can be reached at any hour by calling [phone]. Do not use MyChart messaging for urgent concerns.    DIET:  We do recommend a small meal at first, but then you may proceed to your regular diet.  Drink plenty of fluids but you should avoid alcoholic beverages for 24 hours.  ACTIVITY:  You should plan to take it easy for the rest of today and you should NOT DRIVE or use heavy machinery until tomorrow (because of the sedation medicines used during the test).    FOLLOW UP: Our staff will call the number listed on your records the next business day following your procedure.  We will call around 7:15- 8:00 am to check on you and address any questions or concerns that you may have regarding the information given to you following your procedure. If we do not reach you, we will leave a message.     If any biopsies were taken you will be contacted by phone or by letter within the next 1-3 weeks.  Please call us at [phone] if you have not heard about the biopsies in 3 weeks.    SIGNATURES/CONFIDENTIALITY: You and/or your care partner have signed paperwork which will be entered into your electronic medical record.  These signatures attest to the fact that that the information above on your After Visit Summary has been reviewed and is understood.  Full responsibility of the confidentiality of this discharge  information lies with you and/or your care-partner.

## 2023-03-18 NOTE — Progress Notes (Signed)
GASTROENTEROLOGY PROCEDURE H&P NOTE   Primary Care Physician: Jeani Sow, MD    Reason for Procedure:   Atypical chest pain, rectal bleeding, and elevated CRP  Plan:    EGD/colon  Patient is appropriate for endoscopic procedure(s) in the ambulatory ([facility]) setting.  The nature of the procedure, as well as the risks, benefits, and alternatives were carefully and thoroughly reviewed with the patient. Ample time for discussion and questions allowed. The patient understood, was satisfied, and agreed to proceed.     HPI: Oscar Powers is a 32 y.o. male who presents for EGD and colonoscopy.  Medical history as below.  Tolerated the prep.  No recent chest pain or shortness of breath.  No abdominal pain today.  Past Medical History:  Diagnosis Date   Anxiety     History reviewed. No pertinent surgical history.  Prior to Admission medications   Medication Sig Start Date End Date Taking? Authorizing Provider  aluminum hydroxide-magnesium carbonate (GAVISCON) 95-358 MG/15ML SUSP Take by mouth.   Yes [provider]  cetirizine (ZYRTEC) 10 MG tablet Take 10 mg by mouth daily.   Yes [provider]  famotidine (PEPCID) 20 MG tablet Take 20 mg by mouth 2 (two) times daily.   Yes [provider]  omeprazole (PRILOSEC) 40 MG capsule Take 1 capsule (40 mg total) by mouth in the morning and at bedtime. 11/05/22  Yes Jeani Sow, MD    Current Outpatient Medications  Medication Sig Dispense Refill   aluminum hydroxide-magnesium carbonate (GAVISCON) 95-358 MG/15ML SUSP Take by mouth.     cetirizine (ZYRTEC) 10 MG tablet Take 10 mg by mouth daily.     famotidine (PEPCID) 20 MG tablet Take 20 mg by mouth 2 (two) times daily.     omeprazole (PRILOSEC) 40 MG capsule Take 1 capsule (40 mg total) by mouth in the morning and at bedtime. 180 capsule 1   Current Facility-Administered Medications  Medication Dose Route Frequency Provider Last Rate Last Admin   0.9  %  sodium chloride infusion  500 mL Intravenous Once Sophie Quiles, Carie Caddy, MD        Allergies as of 03/18/2023   (No Known Allergies)    Family History  Problem Relation Age of Onset   Hypertension Mother    Colon cancer Neg Hx    Colon polyps Neg Hx    Esophageal cancer Neg Hx    Stomach cancer Neg Hx    Rectal cancer Neg Hx     Social History   Socioeconomic History   Marital status: Married    Spouse name: Not on file   Number of children: 1   Years of education: Not on file   Highest education level: Master's degree (e.g., MA, MS, MEng, MEd, MSW, MBA)  Occupational History   Not on file  Tobacco Use   Smoking status: Never   Smokeless tobacco: Never  Vaping Use   Vaping status: Never Used  Substance and Sexual Activity   Alcohol use: Not Currently    Comment: states drank in past not since 2020   Drug use: Never   Sexual activity: Yes  Other Topics Concern   Not on file  Social History Narrative   College athletics-marketing [employer]  Married with one child.    Social Determinants of Health   Financial Resource Strain: Not on file  Food Insecurity: Not on file  Transportation Needs: Not on file  Physical Activity: Not on file  Stress: Not on  file  Social Connections: Not on file  Intimate Partner Violence: Not on file    Physical Exam: Vital signs in last 24 hours: @BP  (!) 145/86   Pulse 64   Temp (!) 97.1 F (36.2 C) (Temporal)   Ht 6\' 4"  (1.93 m)   Wt 270 lb (122.5 kg)   SpO2 99%   BMI 32.87 kg/m  GEN: NAD EYE: Sclerae anicteric ENT: MMM CV: Non-tachycardic Pulm: CTA b/l GI: Soft, NT/ND NEURO:  Alert & Oriented x 3   Erick Blinks, MD [facility]  03/18/2023 3:12 PM

## 2023-03-19 ENCOUNTER — Telehealth: Payer: Self-pay

## 2023-03-19 NOTE — Telephone Encounter (Signed)
  Follow up Call-     03/18/2023    2:30 PM  Call back number  Post procedure Call Back phone  # [phone]  Permission to leave phone message Yes     Patient questions:  Do you have a fever, pain , or abdominal swelling? No. Pain Score  0 *  Have you tolerated food without any problems? Yes.    Have you been able to return to your normal activities? Yes.    Do you have any questions about your discharge instructions: Diet   No. Medications  No. Follow up visit  No.  Do you have questions or concerns about your Care? No.  Actions: * If pain score is 4 or above: No action needed, pain <4.

## 2023-03-23 DIAGNOSIS — U071 COVID-19: Secondary | ICD-10-CM

## 2023-03-24 NOTE — Progress Notes (Signed)
Covid positive. Requesting antiviral

## 2023-03-26 ENCOUNTER — Encounter: Payer: Self-pay | Admitting: Family Medicine

## 2023-03-26 ENCOUNTER — Encounter: Payer: Self-pay | Admitting: Internal Medicine

## 2023-03-26 ENCOUNTER — Other Ambulatory Visit: Payer: Self-pay

## 2023-03-26 MED ORDER — VONOPRAZAN FUMARATE 20 MG PO TABS: 20.0000 mg | ORAL_TABLET | Freq: Every day | ORAL | 1 refills | Status: DC

## 2023-03-27 ENCOUNTER — Telehealth: Payer: Self-pay

## 2023-03-27 ENCOUNTER — Ambulatory Visit: Payer: 59 | Admitting: Family Medicine

## 2023-03-27 NOTE — Telephone Encounter (Signed)
Received fax from [facility] requesting a drug change request for Voquezna. They state drug is not covered by patient's plan. Please initiate a PA for Voquezna 20 mg daily.

## 2023-03-28 ENCOUNTER — Other Ambulatory Visit (HOSPITAL_COMMUNITY): Payer: Self-pay

## 2023-04-03 ENCOUNTER — Ambulatory Visit: Payer: 59 | Admitting: Family Medicine

## 2023-04-03 NOTE — Telephone Encounter (Signed)
Please update status of PA for Voquezna. Thanks

## 2023-04-04 NOTE — Telephone Encounter (Signed)
It has been over a week. Please report status of PA

## 2023-04-07 ENCOUNTER — Encounter: Payer: Self-pay | Admitting: Internal Medicine

## 2023-04-08 ENCOUNTER — Other Ambulatory Visit (HOSPITAL_COMMUNITY): Payer: Self-pay

## 2023-04-08 NOTE — Telephone Encounter (Signed)
Looking into this it looks like per test claim this medication is covered and was filled on 08/08 at a [facility] and is too soon to refill until 09/01

## 2023-04-09 NOTE — Telephone Encounter (Signed)
Would do a CT scan of the chest abdomen pelvis with contrast Atypical chest pain Left upper quadrant pain Normal upper and lower endoscopy If this is normal he will need to follow-up with primary care

## 2023-04-10 ENCOUNTER — Other Ambulatory Visit: Payer: Self-pay

## 2023-04-10 DIAGNOSIS — K760 Fatty (change of) liver, not elsewhere classified: Secondary | ICD-10-CM

## 2023-04-10 DIAGNOSIS — R1012 Left upper quadrant pain: Secondary | ICD-10-CM

## 2023-04-10 DIAGNOSIS — R0789 Other chest pain: Secondary | ICD-10-CM

## 2023-04-10 DIAGNOSIS — R634 Abnormal weight loss: Secondary | ICD-10-CM

## 2023-04-16 ENCOUNTER — Ambulatory Visit: Payer: 59 | Admitting: Family Medicine

## 2023-04-16 ENCOUNTER — Encounter: Payer: Self-pay | Admitting: Family Medicine

## 2023-04-16 VITALS — BP 130/70 | HR 67 | Temp 98.1°F | Resp 18 | Ht 76.0 in | Wt 278.0 lb

## 2023-04-16 DIAGNOSIS — K219 Gastro-esophageal reflux disease without esophagitis: Secondary | ICD-10-CM | POA: Insufficient documentation

## 2023-04-16 DIAGNOSIS — E782 Mixed hyperlipidemia: Secondary | ICD-10-CM | POA: Diagnosis not present

## 2023-04-16 DIAGNOSIS — F4323 Adjustment disorder with mixed anxiety and depressed mood: Secondary | ICD-10-CM | POA: Diagnosis not present

## 2023-04-16 DIAGNOSIS — Z8249 Family history of ischemic heart disease and other diseases of the circulatory system: Secondary | ICD-10-CM

## 2023-04-16 DIAGNOSIS — Z1159 Encounter for screening for other viral diseases: Secondary | ICD-10-CM | POA: Diagnosis not present

## 2023-04-16 LAB — COMPREHENSIVE METABOLIC PANEL
ALT: 22 U/L (ref 0–53)
AST: 16 U/L (ref 0–37)
Albumin: 4.4 g/dL (ref 3.5–5.2)
Alkaline Phosphatase: 60 U/L (ref 39–117)
BUN: 14 mg/dL (ref 6–23)
CO2: 26 mEq/L (ref 19–32)
Calcium: 9.4 mg/dL (ref 8.4–10.5)
Chloride: 105 mEq/L (ref 96–112)
Creatinine, Ser: 1.01 mg/dL (ref 0.40–1.50)
GFR: 100.06 mL/min (ref >60.00)
Glucose, Bld: 93 mg/dL (ref 70–99)
Potassium: 4.1 mEq/L (ref 3.5–5.1)
Sodium: 140 mEq/L (ref 135–145)
Total Bilirubin: 0.8 mg/dL (ref 0.2–1.2)
Total Protein: 7.7 g/dL (ref 6.0–8.3)

## 2023-04-16 LAB — LDL CHOLESTEROL, DIRECT: Direct LDL: 191 mg/dL

## 2023-04-16 LAB — LIPID PANEL
Cholesterol: 216 mg/dL — ABNORMAL HIGH (ref 0–200)
HDL: 31 mg/dL — ABNORMAL LOW (ref >39.00)
NonHDL: 185.48
Total CHOL/HDL Ratio: 7
Triglycerides: 281 mg/dL — ABNORMAL HIGH (ref 0.0–149.0)
VLDL: 56.2 mg/dL — ABNORMAL HIGH (ref 0.0–40.0)

## 2023-04-16 MED ORDER — SERTRALINE HCL 50 MG PO TABS: 50.0000 mg | ORAL_TABLET | Freq: Every day | ORAL | 3 refills | Status: DC

## 2023-04-16 NOTE — Patient Instructions (Signed)
It was very nice to see you today!  Sertraline.  1/2 tab/day for 1 wk then whole tablet.   PLEASE NOTE:  If you had any lab tests please let us know if you have not heard back within a few days. You may see your results on MyChart before we have a chance to review them but we will give you a call once they are reviewed by [place]. If we ordered any referrals today, please let us know if you have not heard from their office within the next week.   Please try these tips to maintain a healthy lifestyle:  Eat most of your calories during the day when you are active. Eliminate processed foods including packaged sweets (pies, cakes, cookies), reduce intake of potatoes, white bread, white pasta, and white rice. Look for whole grain options, oat flour or almond flour.  Each meal should contain half fruits/vegetables, one quarter protein, and one quarter carbs (no bigger than a computer mouse).  Cut down on sweet beverages. This includes juice, soda, and sweet tea. Also watch fruit intake, though this is a healthier sweet option, it still contains natural sugar! Limit to 3 servings daily.  Drink at least 1 glass of water with each meal and aim for at least 8 glasses per day  Exercise at least 150 minutes every week.

## 2023-04-16 NOTE — Progress Notes (Signed)
Subjective:     Patient ID: Oscar Powers, male    DOB: Dec 23, 1990, 31 y.o.   MRN: [id number]  Chief Complaint  Patient presents with   Follow-up    6 month follow-up Not fasting     HPI  GERD - He has followed up with Dr. Rhea Belton from GI. He reports damage to the esophagus was found, and he was started on Voquezna 20 mg. He tested positive with Covid about 2 weeks ago, states that worsened his sx. He was advised to stop omeprazole. He continued taking pepcid 20 mg and Voquezna 20 mg for a couple days, with no relief. He resumed omeprazole 40 mg, and in the last 2-3 days has noticed improvement in his sx, but not fully resolved. He has been maintaining changes to his diet, avoiding spicy foods and any other foods that may trigger acid reflux. He notes that stress also triggers his GERD sx. Still getting intermitt CP.  Stress test negative.  Getting CT c/a/p tomorrow  Mood - States he has been stressed and is tired most of the time. Has noticed he is less interested in things, which he attributes to his fatigue. No SI.   Has fmhx of CAD. Cholesterol elevated.    Health Maintenance Due  Topic Date Due   HIV Screening  Never done   Hepatitis C Screening  Never done    Past Medical History:  Diagnosis Date   Anxiety     Past Surgical History:  Procedure Laterality Date   COLONOSCOPY WITH ESOPHAGOGASTRODUODENOSCOPY (EGD)  03/18/2023   Erick Blinks at [facility]     Current Outpatient Medications:    aluminum hydroxide-magnesium carbonate (GAVISCON) 95-358 MG/15ML SUSP, Take by mouth., Disp: , Rfl:    cetirizine (ZYRTEC) 10 MG tablet, Take 10 mg by mouth daily., Disp: , Rfl:    famotidine (PEPCID) 20 MG tablet, Take 20 mg by mouth 2 (two) times daily., Disp: , Rfl:    sertraline (ZOLOFT) 50 MG tablet, Take 1 tablet (50 mg total) by mouth daily., Disp: 30 tablet, Rfl: 3   Vonoprazan Fumarate 20 MG TABS, Take 20 mg by mouth daily at 12 noon., Disp: 30 tablet, Rfl: 1  No Known  Allergies ROS neg/noncontributory except as noted HPI/below      Objective:     BP 130/70   Pulse 67   Temp 98.1 F (36.7 C) (Temporal)   Resp 18   Ht 6\' 4"  (1.93 m)   Wt 278 lb (126.1 kg)   SpO2 97%   BMI 33.84 kg/m  Wt Readings from Last 3 Encounters:  04/16/23 278 lb (126.1 kg)  03/18/23 270 lb (122.5 kg)  01/21/23 292 lb 8 oz (132.7 kg)    Physical Exam   Gen: WDWN NAD HEENT: NCAT, conjunctiva not injected, sclera nonicteric NECK:  supple, no thyromegaly, no nodes, no carotid bruits CARDIAC: RRR, S1S2+, no murmur. LUNGS: CTAB. No wheezes ABDOMEN:  BS+, soft, NTND, No HSM, no masses MSK: no gross abnormalities.  NEURO: A&O x3.  CN II-XII intact.  PSYCH: normal mood. Good eye contact     Assessment & Plan:  Mixed hyperlipidemia -     Lipid panel -     Comprehensive metabolic panel  Family history of early CAD  Gastroesophageal reflux disease without esophagitis  Adjustment disorder with mixed anxiety and depressed mood -     Sertraline HCl; Take 1 tablet (50 mg total) by mouth daily.  Dispense: 30 tablet; Refill: 3  Screening  for viral disease -     Hepatitis C antibody -     HIV Antibody (routine testing w rflx)   Mixed HLD-working some on diet/exercise.  Check labs FH CAD-working on diet/exercise.  Stress test neg.  Chol elevated.  Reck GERD-chronic.  Not well controlled.  Seeing GI.  Getting CT Adjustmend disorder-mixed.  May be contributing to GERD/CP, etc.  Will tx w/zoloft 50mg .  SED.    Return for 1 mo mood(can be virtual.    6 mo annual.    Labs:  Not fasting. Had eggs and chicken sausage 3.5 hours ago.    I,Rachel Rivera,acting as a scribe for Angelena Sole, MD.,have documented all relevant documentation on the behalf of Angelena Sole, MD,as directed by  Angelena Sole, MD while in the presence of Angelena Sole, MD.  I, Angelena Sole, MD, have reviewed all documentation for this visit. The documentation on 04/16/23 for the exam, diagnosis,  procedures, and orders are all accurate and complete.   Angelena Sole, MD

## 2023-04-17 ENCOUNTER — Ambulatory Visit (HOSPITAL_COMMUNITY)
Admission: RE | Admit: 2023-04-17 | Discharge: 2023-04-17 | Disposition: A | Discharge status: Home / Self Care | Payer: 59 | Source: Ambulatory Visit | Attending: Internal Medicine | Admitting: Internal Medicine

## 2023-04-17 DIAGNOSIS — R634 Abnormal weight loss: Secondary | ICD-10-CM | POA: Insufficient documentation

## 2023-04-17 DIAGNOSIS — R0789 Other chest pain: Secondary | ICD-10-CM | POA: Insufficient documentation

## 2023-04-17 DIAGNOSIS — R1012 Left upper quadrant pain: Secondary | ICD-10-CM | POA: Diagnosis not present

## 2023-04-17 DIAGNOSIS — K760 Fatty (change of) liver, not elsewhere classified: Secondary | ICD-10-CM | POA: Insufficient documentation

## 2023-04-17 LAB — HIV ANTIBODY (ROUTINE TESTING W REFLEX): HIV 1&2 Ab, 4th Generation: NONREACTIVE

## 2023-04-17 LAB — HEPATITIS C ANTIBODY: Hepatitis C Ab: NONREACTIVE

## 2023-04-17 MED ORDER — IOHEXOL 300 MG/ML  SOLN
100.0000 mL | Freq: Once | INTRAMUSCULAR | Status: AC | PRN
  Administered 2023-04-17: 100 mL via INTRAVENOUS

## 2023-04-22 NOTE — Progress Notes (Signed)
Cholesterol too high and high risk profile.  We will discuss treatment options at his appt in October.  Of course, work on diet/exercise

## 2023-04-24 ENCOUNTER — Other Ambulatory Visit: Payer: Self-pay

## 2023-04-24 ENCOUNTER — Other Ambulatory Visit (INDEPENDENT_AMBULATORY_CARE_PROVIDER_SITE_OTHER): Payer: 59

## 2023-04-24 DIAGNOSIS — N62 Hypertrophy of breast: Secondary | ICD-10-CM

## 2023-04-24 LAB — TESTOSTERONE: Testosterone: 252.54 ng/dL — ABNORMAL LOW (ref 300.00–890.00)

## 2023-04-25 ENCOUNTER — Encounter: Payer: Self-pay | Admitting: Family Medicine

## 2023-04-25 ENCOUNTER — Other Ambulatory Visit: Payer: Self-pay | Admitting: *Deleted

## 2023-04-25 DIAGNOSIS — R7989 Other specified abnormal findings of blood chemistry: Secondary | ICD-10-CM

## 2023-05-02 ENCOUNTER — Other Ambulatory Visit (INDEPENDENT_AMBULATORY_CARE_PROVIDER_SITE_OTHER): Payer: 59

## 2023-05-02 ENCOUNTER — Other Ambulatory Visit: Payer: Self-pay | Admitting: Family Medicine

## 2023-05-02 DIAGNOSIS — R7989 Other specified abnormal findings of blood chemistry: Secondary | ICD-10-CM

## 2023-05-02 LAB — TESTOSTERONE: Testosterone: 231.5 ng/dL — ABNORMAL LOW (ref 300.00–890.00)

## 2023-05-02 NOTE — Progress Notes (Signed)
Testosterone low.  We will discuss at appointment in October

## 2023-05-08 ENCOUNTER — Other Ambulatory Visit: Payer: 59

## 2023-05-14 ENCOUNTER — Other Ambulatory Visit: Payer: Self-pay | Admitting: Family Medicine

## 2023-05-14 ENCOUNTER — Other Ambulatory Visit (HOSPITAL_BASED_OUTPATIENT_CLINIC_OR_DEPARTMENT_OTHER): Payer: Self-pay

## 2023-05-14 ENCOUNTER — Other Ambulatory Visit: Payer: Self-pay

## 2023-05-14 ENCOUNTER — Other Ambulatory Visit: Payer: Self-pay | Admitting: Family

## 2023-05-14 ENCOUNTER — Encounter: Payer: Self-pay | Admitting: Family Medicine

## 2023-05-14 ENCOUNTER — Other Ambulatory Visit: Payer: 59

## 2023-05-14 MED ORDER — CETIRIZINE HCL 10 MG PO TABS: 10.0000 mg | ORAL_TABLET | Freq: Every day | ORAL | 0 refills | Status: DC

## 2023-05-14 MED ORDER — CETIRIZINE HCL 10 MG PO TABS
10.0000 mg | ORAL_TABLET | Freq: Every day | ORAL | 0 refills | Status: DC
  Filled 2023-05-14: qty 90, 90d supply, fill #0

## 2023-05-14 MED ORDER — FAMOTIDINE 20 MG PO TABS
20.0000 mg | ORAL_TABLET | Freq: Two times a day (BID) | ORAL | 0 refills | Status: DC
  Filled 2023-05-14: qty 90, 45d supply, fill #0

## 2023-05-14 MED ORDER — OMEPRAZOLE 40 MG PO CPDR
40.0000 mg | DELAYED_RELEASE_CAPSULE | Freq: Every day | ORAL | 3 refills | Status: DC
  Filled 2023-05-14: qty 30, 30d supply, fill #0
  Filled 2023-07-01: qty 30, 30d supply, fill #1
  Filled 2023-07-27: qty 30, 30d supply, fill #2
  Filled 2023-09-19: qty 30, 30d supply, fill #3

## 2023-05-15 ENCOUNTER — Other Ambulatory Visit (HOSPITAL_BASED_OUTPATIENT_CLINIC_OR_DEPARTMENT_OTHER): Payer: Self-pay

## 2023-05-27 ENCOUNTER — Telehealth (INDEPENDENT_AMBULATORY_CARE_PROVIDER_SITE_OTHER): Payer: 59 | Admitting: Family Medicine

## 2023-05-27 ENCOUNTER — Encounter: Payer: Self-pay | Admitting: Internal Medicine

## 2023-05-27 ENCOUNTER — Encounter: Payer: Self-pay | Admitting: Family Medicine

## 2023-05-27 ENCOUNTER — Other Ambulatory Visit: Payer: Self-pay | Admitting: Family Medicine

## 2023-05-27 DIAGNOSIS — F4323 Adjustment disorder with mixed anxiety and depressed mood: Secondary | ICD-10-CM

## 2023-05-27 DIAGNOSIS — J029 Acute pharyngitis, unspecified: Secondary | ICD-10-CM | POA: Diagnosis not present

## 2023-05-27 MED ORDER — PENICILLIN V POTASSIUM 500 MG PO TABS: 500.0000 mg | ORAL_TABLET | Freq: Three times a day (TID) | ORAL | 0 refills | Status: AC

## 2023-05-27 MED ORDER — ONDANSETRON HCL 4 MG PO TABS: 4.0000 mg | ORAL_TABLET | Freq: Three times a day (TID) | ORAL | 0 refills | Status: DC | PRN

## 2023-05-27 MED ORDER — SERTRALINE HCL 50 MG PO TABS
50.0000 mg | ORAL_TABLET | Freq: Every day | ORAL | 1 refills | Status: DC
Start: 2023-05-27 — End: 2023-08-19

## 2023-05-27 NOTE — Progress Notes (Signed)
MyChart Video Visit Virtual Visit via Video Note   This visit type was conducted w/patient consent. This format is felt to be most appropriate for this patient at this time. Physical exam was limited by quality of the video and audio technology used for the visit. CMA was able to get the patient set up on a video visit.  Patient location: Home. Patient and provider in visit Provider location: Office  I discussed the limitations of evaluation and management by telemedicine and the availability of in person appointments. The patient expressed understanding and agreed to proceed.  Visit Date: 05/27/2023  Today's healthcare provider: Angelena Sole, MD     Subjective:    Patient ID: Oscar Powers, male    DOB: October 01, 1990, 32 y.o.   MRN: [id number]  Chief Complaint  Patient presents with   Headache   Sore Throat   Abdominal Pain    Sharp pain in lower abdomen  Sx started a couple of days ago   Follow-up    1 month follow-up on moods   Fatigue    HPI Anxiety/depression-zoloft helping.  No SI.  No SE  Past few days, sore throat/HA.  Feels warm but no fever.  Took home flu/covid-this am.  Neg.  Was having intermitt RLQ pain, sharp.  Feeling weak, no appetite.  Not want to do anything.  Not sleeping well.  Dau may have hand foot/mouth(49mo old).   Some nausea.  No v/d.  No dysuria.    Past Medical History:  Diagnosis Date   Anxiety     Past Surgical History:  Procedure Laterality Date   COLONOSCOPY WITH ESOPHAGOGASTRODUODENOSCOPY (EGD)  03/18/2023   Erick Blinks at [facility]    Outpatient Medications Prior to Visit  Medication Sig Dispense Refill   aluminum hydroxide-magnesium carbonate (GAVISCON) 95-358 MG/15ML SUSP Take by mouth.     cetirizine (ZYRTEC) 10 MG tablet Take 1 tablet (10 mg total) by mouth daily. 90 tablet 0   famotidine (PEPCID) 20 MG tablet Take 1 tablet (20 mg total) by mouth 2 (two) times daily. 90 tablet 0   omeprazole (PRILOSEC) 40 MG capsule Take 1 capsule (40 mg  total) by mouth daily. 30 capsule 3   sertraline (ZOLOFT) 50 MG tablet Take 1 tablet (50 mg total) by mouth daily. 30 tablet 3   Vonoprazan Fumarate 20 MG TABS Take 20 mg by mouth daily at 12 noon. 30 tablet 1   No facility-administered medications prior to visit.    No Known Allergies      Objective:     Physical Exam  Vitals and nursing note reviewed.  Constitutional:      General:  in bed.  Flushed cheeks    Appearance: mild/mod ill  HENT:     Head: Normocephalic.  OP-hard to see on video.  No obvious lesions Pulmonary:     Effort: No respiratory distress.  Skin:    General: Skin is dry.     Coloration: Skin is not pale.  Neurological:     Mental Status: Pt is alert and oriented to person, place, and time.  Psychiatric:        Mood and Affect: Mood normal.   There were no vitals taken for this visit.  Wt Readings from Last 3 Encounters:  04/16/23 278 lb (126.1 kg)  03/18/23 270 lb (122.5 kg)  01/21/23 292 lb 8 oz (132.7 kg)       Assessment & Plan:   Problem List Items Addressed This Visit  None Visit Diagnoses     Adjustment disorder with mixed anxiety and depressed mood    -  Primary   Sore throat          Adjustment disorder-mixed anx/depression-improved on zoloft 50mg  daily.  Not feeling like needs to increase. Sore throat.  Covid/flu negative this am.  Daughter w/hand/foot/mouth.  May be that, other virus, strep, etc.  Advised to take tylenol, drink fluids, zofran 4mg  q8hrs prn.  Worse, abd pain worsens, to ER.  Keep [place] informed.  May need to come for strep test or face-face exam.  F/u as sch for annual exam 10/02/23  No orders of the defined types were placed in this encounter.   I discussed the assessment and treatment plan with the patient. The patient was provided an opportunity to ask questions and all were answered. The patient agreed with the plan and demonstrated an understanding of the instructions.   The patient was advised to call back or  seek an in-person evaluation if the symptoms worsen or if the condition fails to improve as anticipated.  No follow-ups on file.  Angelena Sole, MD [facility] [phone] (phone) [phone] (fax)  [facility]

## 2023-05-27 NOTE — Progress Notes (Signed)
Pt w/fevers up to 103.  Red throat.  No exudates.  Taking tylenol.  Motrin.  Advised to alternate q 3 hrs.  Could be viral, mono, strep, other.  Will send penicillin.  Worse, no change, needs to be seen

## 2023-05-27 NOTE — Patient Instructions (Signed)
Sent in zofran for nausea.  Drink small amounts fluids frequently.  Take tylenol.  Worse, other symptoms(abd pain, etc) ER or sch appt.

## 2023-05-28 NOTE — Telephone Encounter (Signed)
Noted  

## 2023-05-30 NOTE — Telephone Encounter (Signed)
Tell patient to continue the omeprazole and stop the vonoprazan but if symptoms recur we will have to go back to both medications for a longer period of time

## 2023-06-29 ENCOUNTER — Emergency Department (HOSPITAL_BASED_OUTPATIENT_CLINIC_OR_DEPARTMENT_OTHER)
Admission: EM | Admit: 2023-06-29 | Discharge: 2023-06-30 | Disposition: A | Discharge status: Home / Self Care | Payer: 59 | Attending: Emergency Medicine | Admitting: Emergency Medicine

## 2023-06-29 ENCOUNTER — Encounter (HOSPITAL_BASED_OUTPATIENT_CLINIC_OR_DEPARTMENT_OTHER): Payer: Self-pay

## 2023-06-29 DIAGNOSIS — R12 Heartburn: Secondary | ICD-10-CM | POA: Diagnosis present

## 2023-06-29 DIAGNOSIS — K2100 Gastro-esophageal reflux disease with esophagitis, without bleeding: Secondary | ICD-10-CM | POA: Insufficient documentation

## 2023-06-29 MED ORDER — DICYCLOMINE HCL 10 MG PO CAPS
10.0000 mg | ORAL_CAPSULE | Freq: Once | ORAL | Status: AC
  Administered 2023-06-29: 10 mg via ORAL
  Filled 2023-06-29: qty 1

## 2023-06-29 MED ORDER — ALUM & MAG HYDROXIDE-SIMETH 200-200-20 MG/5ML PO SUSP
30.0000 mL | Freq: Once | ORAL | Status: AC
  Administered 2023-06-29: 30 mL via ORAL
  Filled 2023-06-29: qty 30

## 2023-06-29 NOTE — ED Triage Notes (Signed)
Pt to triage c/o epigastric throat pain 7/10 burning in nature. Worse after eating. Pt has Rx for treatment of GERD but getting no relief at this time. VSS NAD PT on room air.

## 2023-06-30 MED ORDER — PANTOPRAZOLE SODIUM 40 MG PO TBEC
80.0000 mg | DELAYED_RELEASE_TABLET | Freq: Every day | ORAL | Status: DC
  Administered 2023-06-30: 80 mg via ORAL
  Filled 2023-06-30: qty 2

## 2023-06-30 MED ORDER — LIDOCAINE VISCOUS HCL 2 % MT SOLN
15.0000 mL | Freq: Once | OROMUCOSAL | Status: AC
  Administered 2023-06-30: 15 mL via ORAL
  Filled 2023-06-30: qty 15

## 2023-06-30 MED ORDER — ALUM & MAG HYDROXIDE-SIMETH 200-200-20 MG/5ML PO SUSP
30.0000 mL | Freq: Once | ORAL | Status: AC
  Administered 2023-06-30: 30 mL via ORAL
  Filled 2023-06-30: qty 30

## 2023-06-30 NOTE — ED Provider Notes (Signed)
[place] EMERGENCY DEPARTMENT AT [facility] Provider Note   CSN: [id number] Arrival date & time: 06/29/23  2307     History  Chief Complaint  Patient presents with   Heartburn    Oscar Powers is a 32 y.o. male.  32 year old male with a longstanding history of pretty significant esophagitis/GERD who sees Dr. Rhea Belton with [facility] and is currently on omeprazole and Pepcid and takes Gaviscon as needed presents the ER today with exacerbation of his esophagitis.  Patient states he is having throat pain that radiates up and towards his left chest.  Similar to his had in the past but maybe a little bit more severe.  Gaviscon is not helping this time.  He has lost 70 pounds in the last year secondary to dietary changes and usually eats pretty well but did not eat well at all today having a biscuit and fries for breakfast, cookout platter with multiple things on it for lunch and wonders if maybe this could be exacerbating things.  No shortness of breath, nausea, vomiting, lightheadedness, diaphoresis or other associated symptoms.  He tells me he basically knows that this is related to his reflux but just wants to know what to do to make it feel better.   Heartburn       Home Medications Prior to Admission medications   Medication Sig Start Date End Date Taking? Authorizing Provider  aluminum hydroxide-magnesium carbonate (GAVISCON) 95-358 MG/15ML SUSP Take by mouth.    [provider]  cetirizine (ZYRTEC) 10 MG tablet Take 1 tablet (10 mg total) by mouth daily. 05/14/23   Eulis Foster, FNP  famotidine (PEPCID) 20 MG tablet Take 1 tablet (20 mg total) by mouth 2 (two) times daily. 05/14/23   Jeani Sow, MD  omeprazole (PRILOSEC) 40 MG capsule Take 1 capsule (40 mg total) by mouth daily. 05/14/23   Jeani Sow, MD  ondansetron (ZOFRAN) 4 MG tablet Take 1 tablet (4 mg total) by mouth every 8 (eight) hours as needed for nausea or vomiting. 05/27/23   Jeani Sow, MD  sertraline (ZOLOFT) 50 MG tablet Take 1 tablet (50 mg total) by mouth daily. 05/27/23   Jeani Sow, MD  Vonoprazan Fumarate 20 MG TABS Take 20 mg by mouth daily at 12 noon. 03/26/23   Pyrtle, Carie Caddy, MD      Allergies    Patient has no known allergies.    Review of Systems   Review of Systems  Gastrointestinal:  Positive for heartburn.    Physical Exam Updated Vital Signs BP (!) 108/51   Pulse (!) 59   Temp 98.3 F (36.8 C) (Oral)   Resp 18   Wt 122.5 kg   SpO2 100%   BMI 32.87 kg/m  Physical Exam Vitals and nursing note reviewed.  Constitutional:      Appearance: He is well-developed.  HENT:     Head: Normocephalic and atraumatic.  Cardiovascular:     Rate and Rhythm: Normal rate and regular rhythm.  Pulmonary:     Effort: Pulmonary effort is normal. No respiratory distress.  Abdominal:     General: There is no distension.     Tenderness: There is no abdominal tenderness. There is no guarding or rebound.  Musculoskeletal:        General: Normal range of motion.     Cervical back: Normal range of motion.  Neurological:     Mental Status: He is alert.     ED Results /  Procedures / Treatments   Labs (all labs ordered are listed, but only abnormal results are displayed) Labs Reviewed - No data to display  EKG None  Radiology No results found.  Procedures Procedures    Medications Ordered in ED Medications  pantoprazole (PROTONIX) EC tablet 80 mg (80 mg Oral Given 06/30/23 0154)  alum & mag hydroxide-simeth (MAALOX/MYLANTA) 200-200-20 MG/5ML suspension 30 mL (30 mLs Oral Given 06/29/23 2322)  dicyclomine (BENTYL) capsule 10 mg (10 mg Oral Given 06/29/23 2358)  alum & mag hydroxide-simeth (MAALOX/MYLANTA) 200-200-20 MG/5ML suspension 30 mL (30 mLs Oral Given 06/30/23 0157)    And  lidocaine (XYLOCAINE) 2 % viscous mouth solution 15 mL (15 mLs Oral Given 06/30/23 0156)    ED Course/ Medical Decision Making/ A&P                                  Medical Decision Making Risk OTC drugs. Prescription drug management.   Suspect this is exacerbation of esophagitis.  He has had endoscopy in the past and biopsies did show GERD related esophagitis.  He was on a medication that he stopped a little bit ago that may need to go back on.  Here given extra dose of his omeprazole and some Maalox and it made his pain back down to 0.  He feels well.  He will be discharged to follow with Dr. Rhea Belton as an outpatient.  No indication for cardiac workup as she has had multiple of those done in the past with exact same symptoms and has no other new risk factors.  Low suspicion for PE, pneumonia, fracture or any other etiology requiring imaging further workup or hospitalization at this time.   Final Clinical Impression(s) / ED Diagnoses Final diagnoses:  Gastroesophageal reflux disease with esophagitis without hemorrhage    Rx / DC Orders ED Discharge Orders     None         Breean Nannini, Barbara Cower, MD 06/30/23 [phone]

## 2023-07-01 ENCOUNTER — Other Ambulatory Visit: Payer: Self-pay | Admitting: Family Medicine

## 2023-07-01 ENCOUNTER — Other Ambulatory Visit: Payer: Self-pay

## 2023-07-01 ENCOUNTER — Encounter: Payer: Self-pay | Admitting: Internal Medicine

## 2023-07-01 MED ORDER — VONOPRAZAN FUMARATE 20 MG PO TABS: 20.0000 mg | ORAL_TABLET | Freq: Every day | ORAL | 1 refills | Status: DC

## 2023-07-01 NOTE — Telephone Encounter (Signed)
Okay to resume vonoprazan 10 mg daily in addition to current therapy

## 2023-07-03 ENCOUNTER — Other Ambulatory Visit: Payer: Self-pay | Admitting: Family Medicine

## 2023-07-04 ENCOUNTER — Other Ambulatory Visit: Payer: Self-pay | Admitting: Family Medicine

## 2023-07-04 MED ORDER — FAMOTIDINE 20 MG PO TABS
20.0000 mg | ORAL_TABLET | Freq: Two times a day (BID) | ORAL | 1 refills | Status: DC
  Filled 2023-07-04 – 2023-07-05 (×2): qty 180, 90d supply, fill #0
  Filled 2023-07-27: qty 180, 90d supply, fill #1
  Filled 2023-09-19: qty 60, 30d supply, fill #1
  Filled 2023-10-26: qty 60, 30d supply, fill #2

## 2023-07-05 ENCOUNTER — Other Ambulatory Visit (HOSPITAL_COMMUNITY): Payer: Self-pay

## 2023-07-05 ENCOUNTER — Other Ambulatory Visit (HOSPITAL_BASED_OUTPATIENT_CLINIC_OR_DEPARTMENT_OTHER): Payer: Self-pay

## 2023-07-29 ENCOUNTER — Other Ambulatory Visit (HOSPITAL_COMMUNITY): Payer: Self-pay

## 2023-07-29 ENCOUNTER — Other Ambulatory Visit: Payer: Self-pay

## 2023-08-10 ENCOUNTER — Telehealth: Payer: 59 | Admitting: Physician Assistant

## 2023-08-10 ENCOUNTER — Encounter: Payer: Self-pay | Admitting: Family Medicine

## 2023-08-10 DIAGNOSIS — J069 Acute upper respiratory infection, unspecified: Secondary | ICD-10-CM | POA: Diagnosis not present

## 2023-08-10 MED ORDER — BENZONATATE 100 MG PO CAPS
100.0000 mg | ORAL_CAPSULE | Freq: Two times a day (BID) | ORAL | 0 refills | Status: DC | PRN
Start: 2023-08-10 — End: 2023-10-28

## 2023-08-10 MED ORDER — CETIRIZINE-PSEUDOEPHEDRINE ER 5-120 MG PO TB12: 1.0000 | ORAL_TABLET | Freq: Two times a day (BID) | ORAL | 0 refills | Status: AC

## 2023-08-10 MED ORDER — FLUTICASONE PROPIONATE 50 MCG/ACT NA SUSP: 2.0000 | Freq: Every day | NASAL | 6 refills | Status: AC

## 2023-08-10 NOTE — Progress Notes (Signed)

## 2023-08-16 ENCOUNTER — Encounter: Payer: Self-pay | Admitting: Family Medicine

## 2023-08-19 ENCOUNTER — Other Ambulatory Visit: Payer: Self-pay | Admitting: Family Medicine

## 2023-08-19 DIAGNOSIS — F4323 Adjustment disorder with mixed anxiety and depressed mood: Secondary | ICD-10-CM

## 2023-08-19 MED ORDER — SERTRALINE HCL 100 MG PO TABS: 100.0000 mg | ORAL_TABLET | Freq: Every day | ORAL | 0 refills | Status: DC

## 2023-08-27 ENCOUNTER — Other Ambulatory Visit: Payer: Self-pay | Admitting: Internal Medicine

## 2023-09-20 ENCOUNTER — Other Ambulatory Visit: Payer: Self-pay

## 2023-10-02 ENCOUNTER — Encounter: Payer: 59 | Admitting: Family Medicine

## 2023-10-26 ENCOUNTER — Other Ambulatory Visit: Payer: Self-pay | Admitting: Family Medicine

## 2023-10-26 ENCOUNTER — Other Ambulatory Visit (HOSPITAL_COMMUNITY): Payer: Self-pay

## 2023-10-26 MED ORDER — OMEPRAZOLE 40 MG PO CPDR
40.0000 mg | DELAYED_RELEASE_CAPSULE | Freq: Every day | ORAL | 3 refills | Status: DC
  Filled 2023-10-26: qty 30, 30d supply, fill #0
  Filled 2024-01-30: qty 30, 30d supply, fill #1
  Filled 2024-03-03: qty 30, 30d supply, fill #2
  Filled 2024-04-05: qty 30, 30d supply, fill #3

## 2023-10-27 ENCOUNTER — Other Ambulatory Visit: Payer: Self-pay

## 2023-10-28 ENCOUNTER — Other Ambulatory Visit: Payer: Self-pay

## 2023-10-28 ENCOUNTER — Other Ambulatory Visit: Payer: Self-pay | Admitting: Family Medicine

## 2023-10-28 DIAGNOSIS — F4323 Adjustment disorder with mixed anxiety and depressed mood: Secondary | ICD-10-CM

## 2023-10-28 MED ORDER — SERTRALINE HCL 100 MG PO TABS: 100.0000 mg | ORAL_TABLET | Freq: Every day | ORAL | 0 refills | Status: DC

## 2023-11-02 ENCOUNTER — Other Ambulatory Visit: Payer: Self-pay | Admitting: Internal Medicine

## 2023-11-14 ENCOUNTER — Telehealth: Payer: Self-pay

## 2023-11-14 ENCOUNTER — Ambulatory Visit (INDEPENDENT_AMBULATORY_CARE_PROVIDER_SITE_OTHER): Payer: 59 | Admitting: Family Medicine

## 2023-11-14 ENCOUNTER — Other Ambulatory Visit (HOSPITAL_COMMUNITY): Payer: Self-pay

## 2023-11-14 VITALS — BP 136/79 | HR 66 | Temp 97.9°F | Resp 18 | Ht 76.0 in | Wt 290.2 lb

## 2023-11-14 DIAGNOSIS — Z131 Encounter for screening for diabetes mellitus: Secondary | ICD-10-CM

## 2023-11-14 DIAGNOSIS — K219 Gastro-esophageal reflux disease without esophagitis: Secondary | ICD-10-CM

## 2023-11-14 DIAGNOSIS — R7989 Other specified abnormal findings of blood chemistry: Secondary | ICD-10-CM

## 2023-11-14 DIAGNOSIS — F4323 Adjustment disorder with mixed anxiety and depressed mood: Secondary | ICD-10-CM

## 2023-11-14 DIAGNOSIS — E66812 Obesity, class 2: Secondary | ICD-10-CM | POA: Diagnosis not present

## 2023-11-14 DIAGNOSIS — Z0000 Encounter for general adult medical examination without abnormal findings: Secondary | ICD-10-CM | POA: Diagnosis not present

## 2023-11-14 DIAGNOSIS — Z79899 Other long term (current) drug therapy: Secondary | ICD-10-CM | POA: Diagnosis not present

## 2023-11-14 DIAGNOSIS — E782 Mixed hyperlipidemia: Secondary | ICD-10-CM | POA: Diagnosis not present

## 2023-11-14 DIAGNOSIS — Z13220 Encounter for screening for lipoid disorders: Secondary | ICD-10-CM

## 2023-11-14 DIAGNOSIS — Z6835 Body mass index (BMI) 35.0-35.9, adult: Secondary | ICD-10-CM

## 2023-11-14 LAB — COMPREHENSIVE METABOLIC PANEL WITH GFR
ALT: 19 U/L (ref 0–53)
AST: 13 U/L (ref 0–37)
Albumin: 4.9 g/dL (ref 3.5–5.2)
Alkaline Phosphatase: 68 U/L (ref 39–117)
BUN: 15 mg/dL (ref 6–23)
CO2: 30 meq/L (ref 19–32)
Calcium: 9.4 mg/dL (ref 8.4–10.5)
Chloride: 101 meq/L (ref 96–112)
Creatinine, Ser: 1 mg/dL (ref 0.40–1.50)
GFR: 100.85 mL/min (ref >60.00)
Glucose, Bld: 88 mg/dL (ref 70–99)
Potassium: 4 meq/L (ref 3.5–5.1)
Sodium: 138 meq/L (ref 135–145)
Total Bilirubin: 1.3 mg/dL — ABNORMAL HIGH (ref 0.2–1.2)
Total Protein: 7.9 g/dL (ref 6.0–8.3)

## 2023-11-14 LAB — CBC WITH DIFFERENTIAL/PLATELET
Basophils Absolute: 0.1 10*3/uL (ref 0.0–0.1)
Basophils Relative: 0.6 % (ref 0.0–3.0)
Eosinophils Absolute: 0.5 10*3/uL (ref 0.0–0.7)
Eosinophils Relative: 6.2 % — ABNORMAL HIGH (ref 0.0–5.0)
HCT: 45.1 % (ref 39.0–52.0)
Hemoglobin: 15.1 g/dL (ref 13.0–17.0)
Lymphocytes Relative: 22.9 % (ref 12.0–46.0)
Lymphs Abs: 1.9 10*3/uL (ref 0.7–4.0)
MCHC: 33.5 g/dL (ref 30.0–36.0)
MCV: 82.8 fl (ref 78.0–100.0)
Monocytes Absolute: 0.9 10*3/uL (ref 0.1–1.0)
Monocytes Relative: 10.6 % (ref 3.0–12.0)
Neutro Abs: 4.9 10*3/uL (ref 1.4–7.7)
Neutrophils Relative %: 59.7 % (ref 43.0–77.0)
Platelets: 90 10*3/uL — ABNORMAL LOW (ref 150.0–400.0)
RBC: 5.45 Mil/uL (ref 4.22–5.81)
RDW: 14.5 % (ref 11.5–15.5)
WBC: 8.2 10*3/uL (ref 4.0–10.5)

## 2023-11-14 LAB — LIPID PANEL
Cholesterol: 251 mg/dL — ABNORMAL HIGH (ref 0–200)
HDL: 41.8 mg/dL (ref >39.00)
LDL Cholesterol: 182 mg/dL — ABNORMAL HIGH (ref 0–99)
NonHDL: 209.4
Total CHOL/HDL Ratio: 6
Triglycerides: 136 mg/dL (ref 0.0–149.0)
VLDL: 27.2 mg/dL (ref 0.0–40.0)

## 2023-11-14 MED ORDER — SERTRALINE HCL 100 MG PO TABS: 100.0000 mg | ORAL_TABLET | Freq: Every day | ORAL | 0 refills | Status: DC

## 2023-11-14 MED ORDER — FAMOTIDINE 20 MG PO TABS: 20.0000 mg | ORAL_TABLET | Freq: Two times a day (BID) | ORAL | 1 refills | Status: DC

## 2023-11-14 MED ORDER — AZELASTINE HCL 0.1 % NA SOLN: 2.0000 | Freq: Two times a day (BID) | NASAL | 3 refills | Status: AC

## 2023-11-14 MED ORDER — TESTOSTERONE 4 MG/24HR TD PT24: 1.0000 | MEDICATED_PATCH | Freq: Every day | TRANSDERMAL | 2 refills | Status: DC

## 2023-11-14 MED ORDER — ZEPBOUND 2.5 MG/0.5ML ~~LOC~~ SOAJ
2.5000 mg | SUBCUTANEOUS | 0 refills | Status: DC
Start: 2023-11-14 — End: 2023-12-05

## 2023-11-14 MED ORDER — CETIRIZINE HCL 10 MG PO TABS: 10.0000 mg | ORAL_TABLET | Freq: Every day | ORAL | 3 refills | Status: AC

## 2023-11-14 NOTE — Telephone Encounter (Signed)
 Pharmacy Patient Advocate Encounter   Received notification from CoverMyMeds that prior authorization for Zepbound 2.5 mg/0.5 ml pen injectors is required/requested.   Insurance verification completed.   The patient is insured through [facility] .   Per test claim: PA required; PA submitted to above mentioned insurance via CoverMyMeds Key/confirmation #/EOC [id number] Status is pending

## 2023-11-14 NOTE — Patient Instructions (Signed)
 It was very nice to see you today!  Colace 100-200mg  daily.  Miralax.     PLEASE NOTE:  If you had any lab tests please let us know if you have not heard back within a few days. You may see your results on MyChart before we have a chance to review them but we will give you a call once they are reviewed by [place]. If we ordered any referrals today, please let us know if you have not heard from their office within the next week.   Please try these tips to maintain a healthy lifestyle:  Eat most of your calories during the day when you are active. Eliminate processed foods including packaged sweets (pies, cakes, cookies), reduce intake of potatoes, white bread, white pasta, and white rice. Look for whole grain options, oat flour or almond flour.  Each meal should contain half fruits/vegetables, one quarter protein, and one quarter carbs (no bigger than a computer mouse).  Cut down on sweet beverages. This includes juice, soda, and sweet tea. Also watch fruit intake, though this is a healthier sweet option, it still contains natural sugar! Limit to 3 servings daily.  Drink at least 1 glass of water with each meal and aim for at least 8 glasses per day  Exercise at least 150 minutes every week.

## 2023-11-14 NOTE — Progress Notes (Signed)
 Phone: [phone]   Subjective:  Patient 33 y.o. male presenting for annual physical.  Chief Complaint  Patient presents with   Annual Exam    CPE Fasting Allergy issue    Cpe Discussed the use of AI scribe software for clinical note transcription with the patient, who gave verbal consent to proceed.  History of Present Illness Oscar Powers "Oscar Powers" is a 33 year old male who presents with allergy symptoms and weight management concerns.  He is experiencing symptoms of allergies. He is currently taking Zyrtec, Flonase, and chlorpheniramine for his allergies. His symptoms include a severe headache and nasal drainage/congestion. He is considering using Mucinex to help alleviate his symptoms.  He is dealing with weight management issues despite regular exercise, primarily walking, and sometimes carrying his child during these walks. He has been unable to lose weight, having previously reduced his weight to 270 pounds but has since regained to 284 pounds. He reports eating a healthy diet   He mentions a previous low testosterone level, which was not addressed in a past appointment. He feels tired often and is considering testosterone replacement therapy. He and his partner have discussed the possibility of having another child but have not made a decision.  He is currently taking famotidine, omeprazole, and sertraline. He has not spoken to his doctor about his reflux management recently. No major headaches, dizziness, chest pain, shortness of breath, vomiting, diarrhea, constipation, or trouble urinating. He denies any family history of thyroid cancer.    See problem oriented charting- ROS- ROS: Gen: no fever, chills  Skin: no rash, itching ENT: HPI Eyes: no blurry vision, double vision Resp: no cough, wheeze,SOB CV: no CP, palpitations, LE edema,  GI: no heartburn, n/v/d/c, abd pain GU: no dysuria, urgency, frequency, hematuria MSK: no joint pain, myalgias, back pain Neuro: no  dizziness, headache, weakness, vertigo Psych: no depression, anxiety, insomnia, SI   The following were reviewed and entered/updated in epic: Past Medical History:  Diagnosis Date   Allergy    Anxiety    GERD (gastroesophageal reflux disease) 05/20/22   Patient Active Problem List   Diagnosis Date Noted   Mixed hyperlipidemia 04/16/2023   Family history of early CAD 04/16/2023   Gastroesophageal reflux disease without esophagitis 04/16/2023   Precordial chest pain 12/02/2022   Past Surgical History:  Procedure Laterality Date   COLONOSCOPY WITH ESOPHAGOGASTRODUODENOSCOPY (EGD)  03/18/2023   Erick Blinks at [facility]: [place] [facility]    Family History  Problem Relation Age of Onset   Hypertension Mother    Anxiety disorder Mother    Arthritis Mother    Heart disease Maternal Grandfather    Heart disease Other    Colon cancer Neg Hx    Colon polyps Neg Hx    Esophageal cancer Neg Hx    Stomach cancer Neg Hx    Rectal cancer Neg Hx     Medications- reviewed and updated Current Outpatient Medications  Medication Sig Dispense Refill   azelastine (ASTELIN) 0.1 % nasal spray Place 2 sprays into both nostrils 2 (two) times daily. 30 mL 3   fluticasone (FLONASE) 50 MCG/ACT nasal spray Place 2 sprays into both nostrils daily. 16 g 6   omeprazole (PRILOSEC) 40 MG capsule Take 1 capsule (40 mg total) by mouth daily. 30 capsule 3   tirzepatide (ZEPBOUND) 2.5 MG/0.5ML Pen Inject 2.5 mg into the skin once a week. 2 mL 0   VOQUEZNA 20 MG TABS TAKE 1 TABLET BY MOUTH EVERY DAY 30 tablet 1  cetirizine (ZYRTEC) 10 MG tablet Take 1 tablet (10 mg total) by mouth daily. 90 tablet 3   famotidine (PEPCID) 20 MG tablet Take 1 tablet (20 mg total) by mouth 2 (two) times daily. 180 tablet 1   sertraline (ZOLOFT) 100 MG tablet Take 1 tablet (100 mg total) by mouth daily. 90 tablet 0   No current facility-administered medications for this visit.    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    Objective  Objective:  BP 136/79   Pulse 66   Temp 97.9 F (36.6 C) (Temporal)   Resp 18   Ht 6\' 4"  (1.93 m)   Wt 290 lb 4 oz (131.7 kg)   SpO2 97%   BMI 35.33 kg/m  Physical Exam  Gen: WDWN NAD HEENT: NCAT, conjunctiva not injected, sclera nonicteric TM WNL B, OP moist, no exudates  NECK:  supple, no thyromegaly, no nodes, no carotid bruits CARDIAC: RRR, S1S2+, no murmur. DP 2+B LUNGS: CTAB. No wheezes ABDOMEN:  BS+, soft, NTND, No HSM, no masses EXT:  no edema MSK: no gross abnormalities. MS 5/5 all 4 NEURO: A&O x3.  CN II-XII intact.  PSYCH: normal mood. Good eye contact     Assessment and Plan   Health Maintenance counseling: 1. Anticipatory guidance: Patient counseled regarding regular dental exams q6 months, eye exams yearly, avoiding smoking and second hand smoke, limiting alcohol to 2 beverages per day.   2. Risk factor reduction:  Advised patient of need for regular exercise and diet rich in fruits and vegetables to reduce risk of heart attack and stroke. Exercise- +.   Wt Readings from Last 3 Encounters:  11/14/23 290 lb 4 oz (131.7 kg)  06/29/23 270 lb (122.5 kg)  04/16/23 278 lb (126.1 kg)   3. Immunizations/screenings/ancillary studies Immunization History  Administered Date(s) Administered   Influenza Split 08/18/2012   PFIZER(Purple Top)SARS-COV-2 Vaccination 11/12/2019, 12/03/2019   Tdap 03/02/2022   There are no preventive care reminders to display for this patient.  4. Skin cancer screening- Iadvised regular sunscreen use. Denies worrisome, changing, or new skin lesions.  5. Smoking associated screening: non smoker Wellness examination -     Lipid panel -     Comprehensive metabolic panel with GFR -     CBC with Differential/Platelet -     Hemoglobin A1c -     TSH -     Vitamin B12  Gastroesophageal reflux disease without esophagitis  Mixed  hyperlipidemia  High risk medication use -     Vitamin B12  Adjustment disorder with mixed anxiety and depressed mood -     Sertraline HCl; Take 1 tablet (100 mg total) by mouth daily.  Dispense: 90 tablet; Refill: 0  Class 2 severe obesity due to excess calories with serious comorbidity and body mass index (BMI) of 35.0 to 35.9 in adult (HCC) -     Zepbound; Inject 2.5 mg into the skin once a week.  Dispense: 2 mL; Refill: 0  Low testosterone in male  Other orders -     Azelastine HCl; Place 2 sprays into both nostrils 2 (two) times daily.  Dispense: 30 mL; Refill: 3 -     Cetirizine HCl; Take 1 tablet (10 mg total) by mouth daily.  Dispense: 90 tablet; Refill: 3 -     Famotidine; Take 1 tablet (20 mg total) by mouth 2 (two) times daily.  Dispense: 180 tablet; Refill: 1  Wellness-anticipatory guidance.  Work on Diet/Exercise  Check CBC,CMP,lipids,TSH, A1C.  F/u 1 yr  Assessment and Plan Assessment & Plan Obesity   He struggles with weight loss despite regular exercise and diet, unable to lose weight beyond a certain point. We discussed the potential use of Zepbound, a weight loss medication covered by insurance, which can help by slowing gastric emptying and reducing appetite. Potential side effects include constipation, nausea, and increased heartburn. He was advised on managing these side effects with Colace and Miralax. We discussed the need for a copay card to reduce costs and the importance of maintaining a healthy diet to avoid exacerbating side effects. Prescribe Zepbound for weight loss. Advise obtaining a copay card from [employer]ep[employer] to reduce copay costs. Recommend starting Colace 100-200 mg daily to prevent constipation. Advise drinking plenty of water and consider using Miralax if needed. Instruct to contact if experiencing significant nausea. Schedule follow-up in three months to monitor progress and adjust dosage as needed.  Gastroesophageal Reflux Disease (GERD)   He is  currently taking Voquezna, famotidine, and omeprazole for GERD. Advised to consult with Dr. Rhea Belton to confirm the necessity of all current medications and explore potential alternatives. Weight loss was discussed as a potential way to alleviate symptoms by reducing pressure on the stomach. Advise contacting Dr. Rhea Belton regarding the current GERD medication regimen. Discuss weight loss as a potential method to alleviate GERD symptoms.  Low Testosterone   Testosterone levels are low, contributing to fatigue and difficulty with weight loss. We discussed the potential benefits and risks of testosterone replacement therapy, including improved energy levels and weight loss, but also potential side effects such as acne, liver effects, and impact on fertility. He expressed interest in trying testosterone replacement therapy, specifically the patch form, due to concerns about injections. We discussed the potential impact on fertility and the need to discontinue therapy if planning to conceive. Prescribe testosterone patch for replacement therapy. Schedule follow-up in three months to monitor testosterone levels and assess treatment efficacy.  PDMP checked  Allergic Rhinitis   He experiences symptoms consistent with allergic rhinitis, including headache and nasal drainage. Currently taking Zyrtec, Flonase, and chlorpheniramine (ChlorTabs) for management. Azelastine nasal spray was suggested for additional relief, as it is an antihistamine that works more on the nose. Considering using Mucinex to help with drainage. Prescribe azelastine nasal spray if covered by insurance; otherwise, inform it is available over the counter. Refill Zyrtec prescription. Advise that Mucinex is an option but may not be necessary.  General Health Maintenance   He is actively trying to lose weight and improve health through diet and exercise. Provided additional advice on incorporating strength training exercises into his routine to enhance  weight loss efforts. Encourage incorporating strength training exercises such as squats, lunges, and use of light dumbbells into daily routine.   testosterone Recommended follow up: Return in about 3 months (around 02/14/2024) for testosterone and wt.  Lab/Order associations:+ fasting  Angelena Sole, MD

## 2023-11-15 ENCOUNTER — Telehealth: Payer: Self-pay | Admitting: *Deleted

## 2023-11-15 LAB — TSH: TSH: 1.73 u[IU]/mL (ref 0.35–5.50)

## 2023-11-15 LAB — VITAMIN B12: Vitamin B-12: 191 pg/mL — ABNORMAL LOW (ref 211–911)

## 2023-11-15 NOTE — Telephone Encounter (Signed)
 Received a fax from [facility] saying that Androderm 4 mg Transdermal Patch is unavailable. Spoke with patient to find out if he wanted to try the gel or the shots. Patient stated he would try the gel.

## 2023-11-16 LAB — HEMOGLOBIN A1C: Hgb A1c MFr Bld: 5.6 % (ref 4.6–6.5)

## 2023-11-17 ENCOUNTER — Other Ambulatory Visit: Payer: Self-pay | Admitting: Family Medicine

## 2023-11-17 ENCOUNTER — Encounter: Payer: Self-pay | Admitting: Family Medicine

## 2023-11-17 MED ORDER — TESTOSTERONE 20.25 MG/ACT (1.62%) TD GEL: 1.0000 | Freq: Every day | TRANSDERMAL | 2 refills | Status: DC

## 2023-11-17 NOTE — Progress Notes (Signed)
 Lab ok except: 1.  Cholesterol bad-to point of needing meds.  Hopefully, the zepbound gets approved.   2.  Platelets were low for some reason-repeat cbc 1 week.  If a lot of bruising/bleeding, let me know 3.  B12 is very low-does he want to take daily otc or do B12 injections?

## 2023-11-18 ENCOUNTER — Other Ambulatory Visit: Payer: Self-pay | Admitting: *Deleted

## 2023-11-18 ENCOUNTER — Other Ambulatory Visit (HOSPITAL_COMMUNITY): Payer: Self-pay

## 2023-11-18 DIAGNOSIS — D696 Thrombocytopenia, unspecified: Secondary | ICD-10-CM

## 2023-11-18 NOTE — Telephone Encounter (Signed)
 Pharmacy Patient Advocate Encounter  Received notification from [facility] that Prior Authorization for Zepbound 2.5MG /0.5ML pen-injectors has been APPROVED from 11/15/23 to 07/12/24. Unable to obtain price due to refill too soon rejection, last fill date 11/15/23 next available fill date04/19/25   PA #/Case ID/Reference #: [id number]

## 2023-11-25 ENCOUNTER — Other Ambulatory Visit (INDEPENDENT_AMBULATORY_CARE_PROVIDER_SITE_OTHER)

## 2023-11-25 DIAGNOSIS — D696 Thrombocytopenia, unspecified: Secondary | ICD-10-CM | POA: Diagnosis not present

## 2023-11-26 ENCOUNTER — Encounter: Payer: Self-pay | Admitting: Family Medicine

## 2023-11-26 LAB — CBC WITH DIFFERENTIAL/PLATELET
Basophils Absolute: 0.1 10*3/uL (ref 0.0–0.1)
Basophils Relative: 0.6 % (ref 0.0–3.0)
Eosinophils Absolute: 0.9 10*3/uL — ABNORMAL HIGH (ref 0.0–0.7)
Eosinophils Relative: 9.3 % — ABNORMAL HIGH (ref 0.0–5.0)
HCT: 43 % (ref 39.0–52.0)
Hemoglobin: 14.3 g/dL (ref 13.0–17.0)
Lymphocytes Relative: 46.8 % — ABNORMAL HIGH (ref 12.0–46.0)
Lymphs Abs: 4.6 10*3/uL — ABNORMAL HIGH (ref 0.7–4.0)
MCHC: 33.4 g/dL (ref 30.0–36.0)
MCV: 82.9 fl (ref 78.0–100.0)
Monocytes Absolute: 0.6 10*3/uL (ref 0.1–1.0)
Monocytes Relative: 6.4 % (ref 3.0–12.0)
Neutro Abs: 3.6 10*3/uL (ref 1.4–7.7)
Neutrophils Relative %: 36.9 % — ABNORMAL LOW (ref 43.0–77.0)
Platelets: 219 10*3/uL (ref 150.0–400.0)
RBC: 5.19 Mil/uL (ref 4.22–5.81)
RDW: 14 % (ref 11.5–15.5)
WBC: 9.9 10*3/uL (ref 4.0–10.5)

## 2023-11-26 NOTE — Progress Notes (Signed)
 Platelets normal.  Lookds like allergies flaring

## 2023-12-05 ENCOUNTER — Other Ambulatory Visit: Payer: Self-pay | Admitting: Family Medicine

## 2023-12-05 DIAGNOSIS — E66812 Obesity, class 2: Secondary | ICD-10-CM

## 2023-12-05 MED ORDER — ZEPBOUND 5 MG/0.5ML ~~LOC~~ SOAJ: 5.0000 mg | SUBCUTANEOUS | 0 refills | Status: DC

## 2023-12-09 ENCOUNTER — Telehealth: Payer: Self-pay | Admitting: Pharmacy Technician

## 2023-12-09 ENCOUNTER — Encounter: Payer: Self-pay | Admitting: *Deleted

## 2023-12-09 ENCOUNTER — Encounter: Payer: Self-pay | Admitting: Family Medicine

## 2023-12-09 ENCOUNTER — Other Ambulatory Visit (HOSPITAL_COMMUNITY): Payer: Self-pay

## 2023-12-09 NOTE — Telephone Encounter (Signed)
 Pharmacy Patient Advocate Encounter   Received notification from Patient Advice Request messages that prior authorization for ZEPBOUND  5MG  is required/requested.   Insurance verification completed.   The patient is insured through [employer] .   Per test claim: The current 28 day co-pay is, $1046.97.  No PA needed at this time. This test claim was processed through [facility]- copay amounts may vary at other pharmacies due to pharmacy/plan contracts, or as the patient moves through the different stages of their insurance plan.   Pt's PA is good until 07/12/24, however pt must be enrolled in [employer] for cost-share. Details below

## 2023-12-09 NOTE — Telephone Encounter (Signed)
 PA request was already approved until 07/12/24. New Encounter has been or will be created for follow up. For additional info see Pharmacy Prior Auth telephone encounter from 12/09/23.

## 2023-12-09 NOTE — Telephone Encounter (Signed)
Noted, patient is aware. 

## 2023-12-09 NOTE — Telephone Encounter (Signed)
 Patient notified of message below.

## 2023-12-29 ENCOUNTER — Other Ambulatory Visit: Payer: Self-pay | Admitting: Internal Medicine

## 2024-01-02 ENCOUNTER — Telehealth: Payer: Self-pay | Admitting: *Deleted

## 2024-01-02 NOTE — Telephone Encounter (Signed)
 Called patient to inform him that insurance sent over fax stating that as of July 1st Zepbound  would no longer be covered and the preferred medication was [facility]  Patient stated he can make the change at next refill.

## 2024-01-07 ENCOUNTER — Other Ambulatory Visit: Payer: Self-pay | Admitting: Family Medicine

## 2024-01-07 ENCOUNTER — Encounter: Payer: Self-pay | Admitting: Family Medicine

## 2024-01-07 MED ORDER — WEGOVY 1 MG/0.5ML ~~LOC~~ SOAJ: 1.0000 mg | SUBCUTANEOUS | 0 refills | Status: DC

## 2024-01-26 ENCOUNTER — Encounter: Payer: Self-pay | Admitting: Family Medicine

## 2024-01-27 MED ORDER — TIRZEPATIDE-WEIGHT MANAGEMENT 7.5 MG/0.5ML ~~LOC~~ SOLN: 7.5000 mg | SUBCUTANEOUS | 0 refills | Status: DC

## 2024-01-27 NOTE — Addendum Note (Signed)
 Addended by: Delcie Ruppert on: 01/27/2024 01:16 PM   Modules accepted: Orders

## 2024-01-30 ENCOUNTER — Other Ambulatory Visit: Payer: Self-pay | Admitting: Internal Medicine

## 2024-02-17 ENCOUNTER — Other Ambulatory Visit: Payer: Self-pay | Admitting: Family Medicine

## 2024-02-17 DIAGNOSIS — F4323 Adjustment disorder with mixed anxiety and depressed mood: Secondary | ICD-10-CM

## 2024-02-18 ENCOUNTER — Encounter: Payer: Self-pay | Admitting: Family Medicine

## 2024-02-18 ENCOUNTER — Ambulatory Visit: Admitting: Family Medicine

## 2024-02-18 VITALS — BP 122/70 | HR 70 | Temp 97.7°F | Wt 287.6 lb

## 2024-02-18 DIAGNOSIS — R7989 Other specified abnormal findings of blood chemistry: Secondary | ICD-10-CM | POA: Insufficient documentation

## 2024-02-18 DIAGNOSIS — Z79899 Other long term (current) drug therapy: Secondary | ICD-10-CM | POA: Diagnosis not present

## 2024-02-18 DIAGNOSIS — Z6835 Body mass index (BMI) 35.0-35.9, adult: Secondary | ICD-10-CM

## 2024-02-18 DIAGNOSIS — E66812 Obesity, class 2: Secondary | ICD-10-CM | POA: Diagnosis not present

## 2024-02-18 DIAGNOSIS — E782 Mixed hyperlipidemia: Secondary | ICD-10-CM

## 2024-02-18 LAB — COMPREHENSIVE METABOLIC PANEL WITH GFR
ALT: 47 U/L (ref 0–53)
AST: 13 U/L (ref 0–37)
Albumin: 4.6 g/dL (ref 3.5–5.2)
Alkaline Phosphatase: 58 U/L (ref 39–117)
BUN: 16 mg/dL (ref 6–23)
CO2: 28 meq/L (ref 19–32)
Calcium: 9.2 mg/dL (ref 8.4–10.5)
Chloride: 105 meq/L (ref 96–112)
Creatinine, Ser: 1.13 mg/dL (ref 0.40–1.50)
GFR: 86.93 mL/min (ref >60.00)
Glucose, Bld: 93 mg/dL (ref 70–99)
Potassium: 4.1 meq/L (ref 3.5–5.1)
Sodium: 139 meq/L (ref 135–145)
Total Bilirubin: 0.8 mg/dL (ref 0.2–1.2)
Total Protein: 7.8 g/dL (ref 6.0–8.3)

## 2024-02-18 LAB — CBC WITH DIFFERENTIAL/PLATELET
Basophils Absolute: 0.1 10*3/uL (ref 0.0–0.1)
Basophils Relative: 0.9 % (ref 0.0–3.0)
Eosinophils Absolute: 0.5 10*3/uL (ref 0.0–0.7)
Eosinophils Relative: 7.7 % — ABNORMAL HIGH (ref 0.0–5.0)
HCT: 45.2 % (ref 39.0–52.0)
Hemoglobin: 14.9 g/dL (ref 13.0–17.0)
Lymphocytes Relative: 44.7 % (ref 12.0–46.0)
Lymphs Abs: 2.8 10*3/uL (ref 0.7–4.0)
MCHC: 33.1 g/dL (ref 30.0–36.0)
MCV: 81.7 fl (ref 78.0–100.0)
Monocytes Absolute: 0.5 10*3/uL (ref 0.1–1.0)
Monocytes Relative: 8.1 % (ref 3.0–12.0)
Neutro Abs: 2.4 10*3/uL (ref 1.4–7.7)
Neutrophils Relative %: 38.6 % — ABNORMAL LOW (ref 43.0–77.0)
Platelets: 121 10*3/uL — ABNORMAL LOW (ref 150.0–400.0)
RBC: 5.53 Mil/uL (ref 4.22–5.81)
RDW: 14.3 % (ref 11.5–15.5)
WBC: 6.2 10*3/uL (ref 4.0–10.5)

## 2024-02-18 LAB — LIPID PANEL
Cholesterol: 212 mg/dL — ABNORMAL HIGH (ref 0–200)
HDL: 31 mg/dL — ABNORMAL LOW (ref >39.00)
LDL Cholesterol: 154 mg/dL — ABNORMAL HIGH (ref 0–99)
NonHDL: 181.4
Total CHOL/HDL Ratio: 7
Triglycerides: 135 mg/dL (ref 0.0–149.0)
VLDL: 27 mg/dL (ref 0.0–40.0)

## 2024-02-18 LAB — TESTOSTERONE: Testosterone: 261.56 ng/dL — ABNORMAL LOW (ref 300.00–890.00)

## 2024-02-18 MED ORDER — ZEPBOUND 10 MG/0.5ML ~~LOC~~ SOAJ: 10.0000 mg | SUBCUTANEOUS | 0 refills | Status: DC

## 2024-02-18 MED ORDER — TESTOSTERONE 20.25 MG/ACT (1.62%) TD GEL: 1.0000 | Freq: Every day | TRANSDERMAL | 2 refills | Status: DC

## 2024-02-18 NOTE — Progress Notes (Signed)
 Subjective:     Patient ID: Oscar Powers, male    DOB: 26-Feb-1991, 33 y.o.   MRN: [id number]  Chief Complaint  Patient presents with   Follow-up    HPI Discussed the use of AI scribe software for clinical note transcription with the patient, who gave verbal consent to proceed.  History of Present Illness Oscar Powers is a 33 year old male who presents for follow-up on weight management and medication adjustments. Also Low T  He has resumed taking Zepbound  at a dose of 7.5 mg for weight management, which he started a couple of weeks ago. He experiences no side effects such as nausea, vomiting, diarrhea, constipation, or thoughts of suicide. The medication helps reduce his appetite and 'turn off the food noise', although weight loss has been slow despite dietary efforts. He tried wegovy  and got diarrhea, nausea, fatigue, burping, gassiness, and heartburn  doing better since back on zepbound   His diet is generally healthy, with breakfast typically consisting of one egg with egg whites and oatmeal, and lunch including an apple with natural peanut butter and a protein bar. He drinks plenty of water and rarely snacks between meals. He is concerned about his protein intake.drinks plenty of water  He has been using testosterone  gel, initially confused about the dosage, but has been applying two pumps (one to each arm) for the past two to three weeks. He feels more energetic and less tired since starting the testosterone , with some mild acne as a side effect. No adverse effects such as rage or mood changes.  He has recently started wearing glasses due to farsightedness and astigmatism, particularly in his left eye where light is not reaching the back of the eye properly. He has not yet started using bifocals.  He reports not being consistent with exercise due to a busy schedule. No chest pain, shortness of breath, or any unusual symptoms.    There are no preventive care reminders to display  for this patient.  Past Medical History:  Diagnosis Date   Allergy    Anxiety    GERD (gastroesophageal reflux disease) 05/20/22    Past Surgical History:  Procedure Laterality Date   COLONOSCOPY WITH ESOPHAGOGASTRODUODENOSCOPY (EGD)  03/18/2023   Gordy Starch at [facility]     Current Outpatient Medications:    azelastine  (ASTELIN ) 0.1 % nasal spray, Place 2 sprays into both nostrils 2 (two) times daily., Disp: 30 mL, Rfl: 3   cetirizine  (ZYRTEC ) 10 MG tablet, Take 1 tablet (10 mg total) by mouth daily., Disp: 90 tablet, Rfl: 3   famotidine  (PEPCID ) 20 MG tablet, Take 1 tablet (20 mg total) by mouth 2 (two) times daily., Disp: 180 tablet, Rfl: 1   fluticasone  (FLONASE ) 50 MCG/ACT nasal spray, Place 2 sprays into both nostrils daily., Disp: 16 g, Rfl: 6   omeprazole  (PRILOSEC) 40 MG capsule, Take 1 capsule (40 mg total) by mouth daily., Disp: 30 capsule, Rfl: 3   sertraline  (ZOLOFT ) 100 MG tablet, Take 1 tablet (100 mg total) by mouth daily., Disp: 90 tablet, Rfl: 0   tirzepatide  (ZEPBOUND ) 10 MG/0.5ML Pen, Inject 10 mg into the skin once a week., Disp: 2 mL, Rfl: 0   VOQUEZNA  20 MG TABS, TAKE 1 TABLET BY MOUTH EVERY DAY, Disp: 30 tablet, Rfl: 0   Testosterone  20.25 MG/ACT (1.62%) GEL, Place 1 Pump onto the skin daily. Each shoulder/upper arm- so 1 pump to each arm., Disp: 75 g, Rfl: 2  No Known Allergies ROS neg/noncontributory except  as noted HPI/below      Objective:     BP 122/70   Pulse 70   Temp 97.7 F (36.5 C)   Wt 287 lb 9.6 oz (130.5 kg)   SpO2 98%   BMI 35.01 kg/m  Wt Readings from Last 3 Encounters:  02/18/24 287 lb 9.6 oz (130.5 kg)  11/14/23 290 lb 4 oz (131.7 kg)  06/29/23 270 lb (122.5 kg)    Physical Exam   Gen: WDWN NAD HEENT: NCAT, conjunctiva not injected, sclera nonicteric NECK:  supple, no thyromegaly, no nodes, no carotid bruits CARDIAC: RRR, S1S2+, no murmur. DP 2+B LUNGS: CTAB. No wheezes ABDOMEN:  BS+, soft, NTND, No HSM, no masses EXT:  no  edema MSK: no gross abnormalities.  NEURO: A&O x3.  CN II-XII intact.  PSYCH: normal mood. Good eye contact     Assessment & Plan:  Mixed hyperlipidemia -     Lipid panel  Low testosterone  in male -     CBC with Differential/Platelet -     Comprehensive metabolic panel with GFR -     Lipid panel -     Testosterone  -     Testosterone ; Place 1 Pump onto the skin daily. Each shoulder/upper arm- so 1 pump to each arm.  Dispense: 75 g; Refill: 2  Class 2 severe obesity due to excess calories with serious comorbidity and body mass index (BMI) of 35.0 to 35.9 in adult [facility])  High risk medication use -     CBC with Differential/Platelet -     Comprehensive metabolic panel with GFR  Other orders -     Zepbound ; Inject 10 mg into the skin once a week.  Dispense: 2 mL; Refill: 0  Assessment and Plan Assessment & Plan Obesity   He is currently on Zepbound  7.5 mg for weight management without side effects like nausea, vomiting, diarrhea, constipation, or suicidal thoughts. The medication effectively reduces appetite and food noise, though weight loss is slow, likely due to the transition from Wegovy  to Zepbound . He is encouraged to commit to a healthy diet and exercise regimen to aid weight loss, ensuring adequate protein intake and monitoring calorie intake to avoid under-eating. There are potential insurance issues with Wegovy , and the plan is to increase Zepbound  to 10 mg to find an effective dose. The prescription will be sent to [facility].  Hypogonadism   He is on testosterone  therapy, recently adjusted to two pumps, one for each arm, for the past two to three weeks. He reports feeling more energetic and less tired, with some acne as a side effect, but no adverse effects like rage or mood changes. Testosterone  levels have not been checked since starting the therapy, and there is a potential need to adjust the dosage based on lab results. He will continue the current dosage and monitor for  side effects, adjusting if necessary based on lab results.  Astigmatism and Hyperopia   He has been diagnosed with astigmatism and hyperopia, primarily affecting the left eye, and has started wearing glasses for correction. The eye doctor mentioned the potential need for bifocals as the condition progresses with age. He should continue wearing glasses as prescribed and monitor vision changes, following up with the eye doctor as needed.    Return in about 3 months (around 05/20/2024).  Jenkins CHRISTELLA Carrel, MD

## 2024-02-18 NOTE — Patient Instructions (Signed)
 It was very nice to see you today!  [employer] [facility]    PLEASE NOTE:  If you had any lab tests please let us  know if you have not heard back within a few days. You may see your results on MyChart before we have a chance to review them but we will give you a call once they are reviewed by us . If we ordered any referrals today, please let us  know if you have not heard from their office within the next week.   Please try these tips to maintain a healthy lifestyle:  Eat most of your calories during the day when you are active. Eliminate processed foods including packaged sweets (pies, cakes, cookies), reduce intake of potatoes, white bread, white pasta, and white rice. Look for whole grain options, oat flour or almond flour.  Each meal should contain half fruits/vegetables, one quarter protein, and one quarter carbs (no bigger than a computer mouse).  Cut down on sweet beverages. This includes juice, soda, and sweet tea. Also watch fruit intake, though this is a healthier sweet option, it still contains natural sugar! Limit to 3 servings daily.  Drink at least 1 glass of water with each meal and aim for at least 8 glasses per day  Exercise at least 150 minutes every week.

## 2024-02-19 ENCOUNTER — Ambulatory Visit: Payer: Self-pay | Admitting: Family Medicine

## 2024-02-19 NOTE — Progress Notes (Signed)
 Cholesterol improving-keep up the dietary changes Platelets a little low again-I don't know if from them clumping in the tube-repeat 1 month-can do here or elam-cbcd Testosterone -still low-if feeling better, ok.  If not, can increase to total of 3 pumps/day-so divide it between upper arms/shoulders.  And can repeat testosterone  in 1 month if increases

## 2024-02-20 ENCOUNTER — Other Ambulatory Visit: Payer: Self-pay | Admitting: *Deleted

## 2024-02-20 DIAGNOSIS — R7989 Other specified abnormal findings of blood chemistry: Secondary | ICD-10-CM

## 2024-02-29 ENCOUNTER — Other Ambulatory Visit: Payer: Self-pay | Admitting: Internal Medicine

## 2024-03-02 ENCOUNTER — Encounter: Payer: Self-pay | Admitting: Family Medicine

## 2024-03-03 ENCOUNTER — Other Ambulatory Visit (HOSPITAL_COMMUNITY): Payer: Self-pay

## 2024-03-03 ENCOUNTER — Telehealth: Payer: Self-pay

## 2024-03-03 NOTE — Telephone Encounter (Addendum)
 Pharmacy Patient Advocate Encounter   Received notification from Patient Advice Request messages that prior authorization for Zepbound  10MG /0.5ML pen-injectors is required/requested.   Insurance verification completed.   The patient is insured through [facility] .   Per test claim: PA required; PA started via CoverMyMeds. KEY [id number] . Please see clinical question(s) below that I am not finding the answer to in their chart and advise.   The patient's drug benefit plan provides coverage for other drugs which may be considered for treating your patient. Can your patient be treated with a formulary drug? Secondary Alternative: tirzepatide  (Brand Mounjaro ). [If yes, then provide your patient with a new prescription for tirzepatide  (Brand Mounjaro ). Tirzepatide  (Brand Zepbound ) and tirzepatide  (Brand Mounjaro ) contain the same active ingredient at the same strength and dosage.

## 2024-03-04 ENCOUNTER — Other Ambulatory Visit: Payer: Self-pay | Admitting: Family Medicine

## 2024-03-04 ENCOUNTER — Other Ambulatory Visit (HOSPITAL_COMMUNITY): Payer: Self-pay

## 2024-03-04 MED ORDER — TIRZEPATIDE 10 MG/0.5ML ~~LOC~~ SOAJ
10.0000 mg | SUBCUTANEOUS | 0 refills | Status: DC
Start: 2024-03-04 — End: 2024-03-30

## 2024-03-05 ENCOUNTER — Other Ambulatory Visit (HOSPITAL_COMMUNITY): Payer: Self-pay

## 2024-03-05 NOTE — Telephone Encounter (Signed)
 Pharmacy Patient Advocate Encounter  Received notification from [facility] that Prior Authorization for Mounjaro  10MG /0.107ml has been Approved and closed. Ran a test claim and received $30.00 copay/ 28 days   PA #/Case ID/Reference #: [id number]

## 2024-03-06 ENCOUNTER — Other Ambulatory Visit: Payer: Self-pay | Admitting: Internal Medicine

## 2024-03-17 ENCOUNTER — Telehealth: Admitting: Physician Assistant

## 2024-03-17 DIAGNOSIS — R112 Nausea with vomiting, unspecified: Secondary | ICD-10-CM

## 2024-03-17 MED ORDER — ONDANSETRON HCL 8 MG PO TABS: 8.0000 mg | ORAL_TABLET | Freq: Three times a day (TID) | ORAL | 0 refills | Status: DC | PRN

## 2024-03-17 NOTE — Progress Notes (Signed)

## 2024-03-30 ENCOUNTER — Encounter: Payer: Self-pay | Admitting: Family Medicine

## 2024-03-30 ENCOUNTER — Other Ambulatory Visit: Payer: Self-pay | Admitting: Family Medicine

## 2024-03-30 MED ORDER — TIRZEPATIDE 10 MG/0.5ML ~~LOC~~ SOAJ: 10.0000 mg | SUBCUTANEOUS | 1 refills | Status: DC

## 2024-04-06 ENCOUNTER — Other Ambulatory Visit: Payer: Self-pay | Admitting: Internal Medicine

## 2024-05-13 ENCOUNTER — Other Ambulatory Visit: Payer: Self-pay | Admitting: Family

## 2024-05-13 ENCOUNTER — Other Ambulatory Visit: Payer: Self-pay | Admitting: Family Medicine

## 2024-05-13 DIAGNOSIS — F4323 Adjustment disorder with mixed anxiety and depressed mood: Secondary | ICD-10-CM

## 2024-05-13 NOTE — Telephone Encounter (Signed)
 Need to sch appt for oct

## 2024-05-14 ENCOUNTER — Other Ambulatory Visit (HOSPITAL_COMMUNITY): Payer: Self-pay

## 2024-05-21 ENCOUNTER — Other Ambulatory Visit (HOSPITAL_COMMUNITY): Payer: Self-pay

## 2024-05-30 ENCOUNTER — Other Ambulatory Visit: Payer: Self-pay | Admitting: Family Medicine

## 2024-06-10 ENCOUNTER — Other Ambulatory Visit: Payer: Self-pay | Admitting: Family Medicine

## 2024-06-10 DIAGNOSIS — R7989 Other specified abnormal findings of blood chemistry: Secondary | ICD-10-CM

## 2024-06-12 ENCOUNTER — Ambulatory Visit: Admitting: Family Medicine

## 2024-06-12 VITALS — BP 110/60 | HR 60 | Temp 97.9°F | Ht 76.0 in | Wt 283.6 lb

## 2024-06-12 DIAGNOSIS — E538 Deficiency of other specified B group vitamins: Secondary | ICD-10-CM

## 2024-06-12 DIAGNOSIS — F4323 Adjustment disorder with mixed anxiety and depressed mood: Secondary | ICD-10-CM | POA: Diagnosis not present

## 2024-06-12 DIAGNOSIS — R7989 Other specified abnormal findings of blood chemistry: Secondary | ICD-10-CM | POA: Diagnosis not present

## 2024-06-12 DIAGNOSIS — E782 Mixed hyperlipidemia: Secondary | ICD-10-CM | POA: Diagnosis not present

## 2024-06-12 DIAGNOSIS — Z23 Encounter for immunization: Secondary | ICD-10-CM

## 2024-06-12 DIAGNOSIS — L918 Other hypertrophic disorders of the skin: Secondary | ICD-10-CM

## 2024-06-12 LAB — VITAMIN B12: Vitamin B-12: 1361 pg/mL — ABNORMAL HIGH (ref 211–911)

## 2024-06-12 LAB — CBC WITH DIFFERENTIAL/PLATELET
Basophils Absolute: 0.1 K/uL (ref 0.0–0.1)
Basophils Relative: 0.9 % (ref 0.0–3.0)
Eosinophils Absolute: 0.2 K/uL (ref 0.0–0.7)
Eosinophils Relative: 2.2 % (ref 0.0–5.0)
HCT: 45.1 % (ref 39.0–52.0)
Hemoglobin: 15 g/dL (ref 13.0–17.0)
Lymphocytes Relative: 35.9 % (ref 12.0–46.0)
Lymphs Abs: 3 K/uL (ref 0.7–4.0)
MCHC: 33.3 g/dL (ref 30.0–36.0)
MCV: 81.4 fl (ref 78.0–100.0)
Monocytes Absolute: 0.7 K/uL (ref 0.1–1.0)
Monocytes Relative: 7.9 % (ref 3.0–12.0)
Neutro Abs: 4.4 K/uL (ref 1.4–7.7)
Neutrophils Relative %: 53.1 % (ref 43.0–77.0)
Platelets: 135 K/uL — ABNORMAL LOW (ref 150.0–400.0)
RBC: 5.53 Mil/uL (ref 4.22–5.81)
RDW: 14.9 % (ref 11.5–15.5)
WBC: 8.4 K/uL (ref 4.0–10.5)

## 2024-06-12 LAB — COMPREHENSIVE METABOLIC PANEL WITH GFR
ALT: 20 U/L (ref 0–53)
AST: 13 U/L (ref 0–37)
Albumin: 4.9 g/dL (ref 3.5–5.2)
Alkaline Phosphatase: 62 U/L (ref 39–117)
BUN: 13 mg/dL (ref 6–23)
CO2: 26 meq/L (ref 19–32)
Calcium: 9.4 mg/dL (ref 8.4–10.5)
Chloride: 103 meq/L (ref 96–112)
Creatinine, Ser: 1.12 mg/dL (ref 0.40–1.50)
GFR: 87.67 mL/min (ref >60.00)
Glucose, Bld: 95 mg/dL (ref 70–99)
Potassium: 4.2 meq/L (ref 3.5–5.1)
Sodium: 140 meq/L (ref 135–145)
Total Bilirubin: 1.1 mg/dL (ref 0.2–1.2)
Total Protein: 7.9 g/dL (ref 6.0–8.3)

## 2024-06-12 LAB — TESTOSTERONE: Testosterone: 334.42 ng/dL (ref 300.00–890.00)

## 2024-06-12 LAB — LIPID PANEL
Cholesterol: 228 mg/dL — ABNORMAL HIGH (ref 0–200)
HDL: 33.9 mg/dL — ABNORMAL LOW (ref >39.00)
LDL Cholesterol: 158 mg/dL — ABNORMAL HIGH (ref 0–99)
NonHDL: 194.26
Total CHOL/HDL Ratio: 7
Triglycerides: 183 mg/dL — ABNORMAL HIGH (ref 0.0–149.0)
VLDL: 36.6 mg/dL (ref 0.0–40.0)

## 2024-06-12 MED ORDER — OMEPRAZOLE 40 MG PO CPDR: 40.0000 mg | DELAYED_RELEASE_CAPSULE | Freq: Every day | ORAL | 1 refills | Status: AC

## 2024-06-12 MED ORDER — TIRZEPATIDE 12.5 MG/0.5ML ~~LOC~~ SOAJ: 12.5000 mg | SUBCUTANEOUS | 2 refills | Status: DC

## 2024-06-12 MED ORDER — SERTRALINE HCL 100 MG PO TABS: 100.0000 mg | ORAL_TABLET | Freq: Every day | ORAL | 0 refills | Status: AC

## 2024-06-12 NOTE — Patient Instructions (Signed)

## 2024-06-12 NOTE — Progress Notes (Signed)
 Subjective:     Patient ID: Oscar Powers, male    DOB: 1991/03/28, 33 y.o.   MRN: [id number]  Chief Complaint  Patient presents with   Hyperlipidemia    104mo follow up; doing well no concerns    Discussed the use of AI scribe software for clinical note transcription with the patient, who gave verbal consent to proceed.  History of Present Illness Oscar Powers is a 33 year old male who presents for follow-up on testosterone , cholesterol, weight, and mood management.  Mood has been stable on Zoloft  100 mg daily, with no significant anxiety or thoughts of suicide. No side effects from the medication are reported.  He is using one pump of testosterone  on each arm daily. He feels tired and lacks drive over the past week, attributing this to stress or lack of sleep. He is monitoring these symptoms to determine if they persist.  For gastroesophageal reflux disease, he takes famotidine  daily and omeprazole  as needed. He experienced increased acid reflux initially after taking Mounjaro  but reports improvement thereafter. No chest pain, shortness of breath, vomiting, diarrhea, constipation, or food getting stuck.  He is on Mounjaro  10 mg weekly for weight management. Weight loss was initially rapid but has since slowed. He focuses on a high-protein diet, including eggs, protein bars, nuts, and chicken, and drinks mainly water. He exercises regularly, primarily walking.  He has a family history of hyperlipidemia. He is also taking B12 supplements due to previously low levels.  He has skin tags that he would like removed    There are no preventive care reminders to display for this patient.   Past Medical History:  Diagnosis Date   Allergy    Anxiety    GERD (gastroesophageal reflux disease) 05/20/22    Past Surgical History:  Procedure Laterality Date   COLONOSCOPY WITH ESOPHAGOGASTRODUODENOSCOPY (EGD)  03/18/2023   Gordy Starch at [facility]     Current Outpatient Medications:     cetirizine  (ZYRTEC ) 10 MG tablet, Take 1 tablet (10 mg total) by mouth daily., Disp: 90 tablet, Rfl: 3   famotidine  (PEPCID ) 20 MG tablet, TAKE 1 TABLET(20 MG) BY MOUTH TWICE DAILY, Disp: 180 tablet, Rfl: 1   fluticasone  (FLONASE ) 50 MCG/ACT nasal spray, Place 2 sprays into both nostrils daily., Disp: 16 g, Rfl: 6   Testosterone  1.62 % GEL, PLACE 2 PUMPS(1 PUMP TO EACH UPPER ARM) DAILY, Disp: 75 g, Rfl: 5   tirzepatide  (MOUNJARO ) 12.5 MG/0.5ML Pen, Inject 12.5 mg into the skin once a week., Disp: 2 mL, Rfl: 2   azelastine  (ASTELIN ) 0.1 % nasal spray, Place 2 sprays into both nostrils 2 (two) times daily., Disp: 30 mL, Rfl: 3   omeprazole  (PRILOSEC) 40 MG capsule, Take 1 capsule (40 mg total) by mouth daily., Disp: 90 capsule, Rfl: 1   sertraline  (ZOLOFT ) 100 MG tablet, Take 1 tablet (100 mg total) by mouth daily., Disp: 90 tablet, Rfl: 0  No Known Allergies ROS neg/noncontributory except as noted HPI/below      Objective:     BP 110/60   Pulse 60   Temp 97.9 F (36.6 C)   Ht 6' 4 (1.93 m)   Wt 283 lb 9.6 oz (128.6 kg)   SpO2 98%   BMI 34.52 kg/m  Wt Readings from Last 3 Encounters:  06/12/24 283 lb 9.6 oz (128.6 kg)  02/18/24 287 lb 9.6 oz (130.5 kg)  11/14/23 290 lb 4 oz (131.7 kg)    Physical Exam GENERAL: Well developed  well nourished no acute distress HEAD EYES EARS NOSE THROAT: Normocephalic atraumatic, conjunctiva not injected, sclera nonicteric CARDIAC: Regular rate and rhythm, S1 S2 present, no murmur, dorsalis pedis 2 plus bilaterally, heart normal NECK: Supple, no thyromegaly, no nodes, no carotid bruits LUNGS: Clear to auscultation bilaterally, no wheezes ABDOMEN: Bowel sounds present, soft, non tender non distended, no hepatosplenomegaly, no masses EXTREMITIES: No edema MUSCULOSKELETAL: No gross abnormalities NEUROLOGICAL: Alert and oriented x3, cranial nerves II through XII intact PSYCHIATRIC: Normal mood, good eye contact       Assessment & Plan:  Mixed  hyperlipidemia -     Lipid panel -     Comprehensive metabolic panel with GFR  Low testosterone  in male -     Lipid panel -     Comprehensive metabolic panel with GFR -     Testosterone   Encounter for immunization -     Flu vaccine trivalent PF, 6mos and older(Flulaval,Afluria,Fluarix,Fluzone)  Adjustment disorder with mixed anxiety and depressed mood -     Sertraline  HCl; Take 1 tablet (100 mg total) by mouth daily.  Dispense: 90 tablet; Refill: 0  Vitamin B12 deficiency -     Vitamin B12 -     CBC with Differential/Platelet  Other orders -     Omeprazole ; Take 1 capsule (40 mg total) by mouth daily.  Dispense: 90 capsule; Refill: 1 -     Tirzepatide ; Inject 12.5 mg into the skin once a week.  Dispense: 2 mL; Refill: 2    Assessment and Plan Assessment & Plan Obesity   Obesity is managed with Mounjaro  10 mg weekly. Initial weight loss was rapid but has slowed. He is focusing on a high-protein diet and regular exercise, primarily walking.  Increase Mounjaro  dosage to 12.5 mg weekly with two refills. Encourage continued exercise and dietary modifications. Advise to contact the office if further dosage increase is needed before the next visit.  Testosterone  deficiency   Testosterone  deficiency is managed with topical testosterone  gel. He reports feeling tired and lacking drive over the past week, possibly due to stress or seasonal changes. Current dosage is one pump per arm daily. Monitor symptoms and consider increasing testosterone  dosage if symptoms persist for 3-4 weeks. Order a testosterone  level test to assess current status.  Mixed hyperlipidemia   Mixed hyperlipidemia with a family history is being managed through diet and exercise. No recent blood work to assess current cholesterol levels. Order a cholesterol blood test to assess current levels. Encourage continued focus on diet and exercise.  Gastroesophageal reflux disease   Gastroesophageal reflux disease is managed  with daily famotidine  and as-needed omeprazole . Refill omeprazole  with a 90-day supply.  Depression   Depression is managed with Zoloft  100 mg daily. He reports functioning well without significant anxiety or side effects.  Vitamin B12 deficiency   Vitamin B12 deficiency is managed with B12 supplements. Order a Vitamin B12 level test to assess current status.  Skin tags   He has skin tags that he wishes to have removed. Refer to a dermatologist for evaluation and potential removal of skin tags.     Return in about 3 months (around 09/12/2024) for wt, testosterone ,   6 mo for annual.  Jenkins CHRISTELLA Carrel, MD

## 2024-06-14 ENCOUNTER — Ambulatory Visit: Payer: Self-pay | Admitting: Family Medicine

## 2024-06-14 NOTE — Progress Notes (Signed)
 Testosterone  better Platelets stable Your cholesterol levels are elevated.  Work on low cholesterol and lower carbs/sugars diet and  get exercise to try to lower your cholesterol.  B12 high-can decrease amount

## 2024-07-22 ENCOUNTER — Other Ambulatory Visit (HOSPITAL_COMMUNITY): Payer: Self-pay

## 2024-08-10 ENCOUNTER — Encounter: Payer: Self-pay | Admitting: Family Medicine

## 2024-09-12 ENCOUNTER — Other Ambulatory Visit: Payer: Self-pay | Admitting: Medical Genetics

## 2024-09-14 ENCOUNTER — Telehealth: Payer: Self-pay | Admitting: Family Medicine

## 2024-09-14 NOTE — Telephone Encounter (Signed)
 Lvm to get r/s due to inclement of weather-aw

## 2024-09-15 ENCOUNTER — Ambulatory Visit: Admitting: Family Medicine

## 2024-09-21 ENCOUNTER — Other Ambulatory Visit: Payer: Self-pay | Admitting: Family Medicine

## 2024-10-14 ENCOUNTER — Other Ambulatory Visit

## 2024-12-14 ENCOUNTER — Encounter: Admitting: Family Medicine

## 2025-01-25 ENCOUNTER — Ambulatory Visit: Admitting: Physician Assistant
